# Patient Record
Sex: Male | Born: 1970 | Race: Black or African American | Hispanic: No | Marital: Single | Smoking: Never smoker
Health system: Southern US, Community
[De-identification: ages and names within clinical notes are randomized; demographics above are authoritative.]

## PROBLEM LIST (undated history)

## (undated) DIAGNOSIS — G937 Reye's syndrome: Secondary | ICD-10-CM

## (undated) DIAGNOSIS — G4730 Sleep apnea, unspecified: Secondary | ICD-10-CM

## (undated) DIAGNOSIS — F419 Anxiety disorder, unspecified: Secondary | ICD-10-CM

## (undated) DIAGNOSIS — I10 Essential (primary) hypertension: Secondary | ICD-10-CM

## (undated) DIAGNOSIS — E785 Hyperlipidemia, unspecified: Secondary | ICD-10-CM

## (undated) DIAGNOSIS — M069 Rheumatoid arthritis, unspecified: Secondary | ICD-10-CM

## (undated) DIAGNOSIS — R569 Unspecified convulsions: Secondary | ICD-10-CM

## (undated) HISTORY — DX: Hyperlipidemia, unspecified: E78.5

## (undated) HISTORY — DX: Sleep apnea, unspecified: G47.30

## (undated) HISTORY — DX: Rheumatoid arthritis, unspecified: M06.9

## (undated) HISTORY — PX: PLEURAL SCARIFICATION: SHX748

## (undated) HISTORY — DX: Reye's syndrome: G93.7

## (undated) HISTORY — PX: JOINT REPLACEMENT: SHX530

## (undated) HISTORY — DX: Unspecified convulsions: R56.9

## (undated) HISTORY — DX: Anxiety disorder, unspecified: F41.9

---

## 2019-07-17 ENCOUNTER — Ambulatory Visit (INDEPENDENT_AMBULATORY_CARE_PROVIDER_SITE_OTHER): Payer: BC Managed Care – PPO

## 2019-07-17 ENCOUNTER — Other Ambulatory Visit: Payer: Self-pay

## 2019-07-17 ENCOUNTER — Ambulatory Visit (HOSPITAL_COMMUNITY)
Admission: EM | Admit: 2019-07-17 | Discharge: 2019-07-17 | Disposition: A | Discharge status: Home / Self Care | Payer: BC Managed Care – PPO | Attending: Family Medicine | Admitting: Family Medicine

## 2019-07-17 ENCOUNTER — Encounter (HOSPITAL_COMMUNITY): Payer: Self-pay | Admitting: Emergency Medicine

## 2019-07-17 DIAGNOSIS — S9031XA Contusion of right foot, initial encounter: Secondary | ICD-10-CM | POA: Diagnosis not present

## 2019-07-17 DIAGNOSIS — M79671 Pain in right foot: Secondary | ICD-10-CM | POA: Diagnosis not present

## 2019-07-17 DIAGNOSIS — S99921A Unspecified injury of right foot, initial encounter: Secondary | ICD-10-CM | POA: Diagnosis not present

## 2019-07-17 HISTORY — DX: Essential (primary) hypertension: I10

## 2019-07-17 NOTE — ED Triage Notes (Signed)
Pt states he was in bed sleeping, had a dream and kicked his footboard during the dream.His right foot is having pain in and below the great toe.

## 2019-07-17 NOTE — Discharge Instructions (Signed)
Tylenol 5000-1000 mg every 4-6 hours, add in ibuprofen as needed Ice and elevate  Follow up if not improving or worsening

## 2019-07-17 NOTE — ED Provider Notes (Signed)
[facility]    CSN: [id number] Arrival date & time: 07/17/19  [id number]      History   Chief Complaint Chief Complaint  Patient presents with  . Foot Injury    right    HPI Scott Joseph is a 49 y.o. male history of arthritis, hypertension, presenting today for evaluation of right foot injury.  Patient states that approximately 2 nights ago he had a dream and accidentally kicked the footboard of his bed.  Since he has had pain at the base of his right great toe.  Initially had a limp, but this has been proved.  He has been taking ibuprofen as well as trying to ice and elevate.  Denies prior fracture to this foot.  Denies any ankle or lower leg pain.  HPI  Past Medical History:  Diagnosis Date  . Arthritis   . Hypertension     There are no problems to display for this patient.   Past Surgical History:  Procedure Laterality Date  . JOINT REPLACEMENT         Home Medications    Prior to Admission medications   Medication Sig Start Date End Date Taking? Authorizing Provider  amLODipine (NORVASC) 10 MG tablet Take 10 mg by mouth daily. 07/11/19  Yes [provider]  carvedilol (COREG) 3.125 MG tablet Take 3.125 mg by mouth 2 (two) times daily. 07/11/19  Yes [provider]  escitalopram (LEXAPRO) 10 MG tablet Take 10 mg by mouth daily. 07/11/19  Yes [provider]  lisinopril (ZESTRIL) 20 MG tablet Take 20 mg by mouth daily. 07/11/19  Yes [provider]    Family History Family History  Problem Relation Age of Onset  . Hypertension Mother   . CAD Mother   . Cancer Father   . Hypertension Father     Social History Social History   Tobacco Use  . Smoking status: Never Smoker  . Smokeless tobacco: Never Used  Substance Use Topics  . Alcohol use: Not Currently  . Drug use: Never     Allergies   Patient has no known allergies.   Review of Systems Review of Systems  Constitutional: Negative for fatigue and  fever.  Respiratory: Negative for shortness of breath.   Cardiovascular: Negative for chest pain and leg swelling.  Gastrointestinal: Negative for nausea and vomiting.  Musculoskeletal: Positive for arthralgias, gait problem and joint swelling. Negative for myalgias.  Skin: Negative for color change, rash and wound.  Neurological: Negative for dizziness, syncope, weakness, light-headedness and headaches.     Physical Exam Triage Vital Signs ED Triage Vitals  Enc Vitals Group     BP 07/17/19 1009 (!) 167/125     Pulse Rate 07/17/19 1009 72     Resp 07/17/19 1009 18     Temp 07/17/19 1009 98.1 F (36.7 C)     Temp Source 07/17/19 1009 Oral     SpO2 07/17/19 1009 97 %     Weight --      Height --      Head Circumference --      Peak Flow --      Pain Score 07/17/19 1003 2     Pain Loc --      Pain Edu? --      Excl. in [place]? --    No data found.  Updated Vital Signs BP (!) 167/125 (BP Location: Left Arm)   Pulse 72   Temp 98.1 F (36.7 C) (Oral)  Resp 18   SpO2 97%   Visual Acuity Right Eye Distance:   Left Eye Distance:   Bilateral Distance:    Right Eye Near:   Left Eye Near:    Bilateral Near:     Physical Exam Vitals and nursing note reviewed.  Constitutional:      Appearance: He is well-developed.     Comments: No acute distress  HENT:     Head: Normocephalic and atraumatic.     Nose: Nose normal.  Eyes:     Conjunctiva/sclera: Conjunctivae normal.  Cardiovascular:     Rate and Rhythm: Normal rate.  Pulmonary:     Effort: Pulmonary effort is normal. No respiratory distress.  Abdominal:     General: There is no distension.  Musculoskeletal:        General: Normal range of motion.     Cervical back: Neck supple.     Comments: Right foot: No obvious swelling or deformity, tenderness to palpation to distal aspect of first metatarsal, slight hyperpigmentation in this area Dorsalis pedis 2+; sensation intact distally  Skin:    General: Skin is warm  and dry.  Neurological:     Mental Status: He is alert and oriented to person, place, and time.      UC Treatments / Results  Labs (all labs ordered are listed, but only abnormal results are displayed) Labs Reviewed - No data to display  EKG   Radiology DG Foot Complete Right  Result Date: 07/17/2019 CLINICAL DATA:  Posttraumatic right foot pain EXAM: RIGHT FOOT COMPLETE - 3+ VIEW COMPARISON:  None. FINDINGS: There is no evidence of fracture or dislocation. 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 IMPRESSION: 1. No acute finding. 2. Notable first MTP osteoarthritis. Electronically Signed   By: Monte Fantasia M.D.   On: 07/17/2019 10:18    Procedures Procedures (including critical care time)  Medications Ordered in UC Medications - No data to display  Initial Impression / Assessment and Plan / UC Course  I have reviewed the triage vital signs and the nursing notes.  Pertinent labs & imaging results that were available during my care of the patient were reviewed by me and considered in my medical decision making (see chart for details).     X-ray negative for acute bony abnormality, suggestive of first metatarsal osteoarthritis.  Likely injury flaring this.  Will recommend to continue anti-inflammatories ice and elevate would expect gradual resolution.  Discussed elevated blood pressure with patient, he believes taking ibuprofen has contributed to this.  Discussed warning signs to monitor and to continue to monitor blood pressure at home.  Discussed strict return precautions. Patient verbalized understanding and is agreeable with plan.  Final Clinical Impressions(s) / UC Diagnoses   Final diagnoses:  Contusion of right foot, initial encounter     Discharge Instructions     Tylenol 5000-1000 mg every 4-6 hours, add in ibuprofen as needed Ice and elevate  Follow up if not improving or worsening   ED Prescriptions    None      PDMP not reviewed this encounter.   Janith Lima, PA-C 07/17/19 1032

## 2019-07-30 ENCOUNTER — Other Ambulatory Visit: Payer: Self-pay

## 2019-07-30 ENCOUNTER — Ambulatory Visit (INDEPENDENT_AMBULATORY_CARE_PROVIDER_SITE_OTHER): Payer: BC Managed Care – PPO

## 2019-07-30 ENCOUNTER — Encounter: Payer: Self-pay | Admitting: Medical-Surgical

## 2019-07-30 ENCOUNTER — Ambulatory Visit (INDEPENDENT_AMBULATORY_CARE_PROVIDER_SITE_OTHER): Payer: BC Managed Care – PPO | Admitting: Medical-Surgical

## 2019-07-30 VITALS — BP 145/90 | HR 112 | Temp 98.3°F | Ht 62.25 in | Wt 167.2 lb

## 2019-07-30 DIAGNOSIS — M069 Rheumatoid arthritis, unspecified: Secondary | ICD-10-CM

## 2019-07-30 DIAGNOSIS — I10 Essential (primary) hypertension: Secondary | ICD-10-CM

## 2019-07-30 DIAGNOSIS — F419 Anxiety disorder, unspecified: Secondary | ICD-10-CM

## 2019-07-30 DIAGNOSIS — M19072 Primary osteoarthritis, left ankle and foot: Secondary | ICD-10-CM | POA: Diagnosis not present

## 2019-07-30 DIAGNOSIS — M25475 Effusion, left foot: Secondary | ICD-10-CM | POA: Insufficient documentation

## 2019-07-30 MED ORDER — HYDROCODONE-ACETAMINOPHEN 5-325 MG PO TABS: 1.0000 | ORAL_TABLET | Freq: Three times a day (TID) | ORAL | 0 refills | Status: DC | PRN

## 2019-07-30 NOTE — Progress Notes (Signed)
New Patient Office Visit  Subjective:  Patient ID: Scott Joseph, male    DOB: Mar 18, 1971  Age: 49 y.o. MRN: [id number]  CC:  Chief Complaint  Patient presents with  . Establish Care  . Rheumatoid Arthritis    HPI Scott Joseph is a 49 year old male who presents today to establish care.  Moved from [place] in August 2020.  Works for a [employer], broadcasting/film/video.  Hypertension: Taking amlodipine, Coreg, and lisinopril.  Checks blood pressures at home intermittently.  Checked last night with reading of 131/86.  Eats a low-salt diet and tries to avoid processed foods.  Denies chest pain, shortness of breath, palpitations, lower extremity edema.  BP elevated in office but patient reports he was very anxious about the appointment today.   Rheumatoid arthritis: Reports having RA since the age of 60.  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  In his teenage years, was treated with Naprosyn.  Reports Naprosyn lost its efficacy after approximately 17 years.  No current treatment regimen but interested in starting medication to help manage symptoms.  Reports joints most often affected are right elbow, bilateral hands, and bilateral feet.   Anxiety: Taking Lexapro daily.  Reports no anxiety attacks since he moved back to [place].  PHQ-9 score of 3 with gad 7 score of 1.  Denies SI/HI.  Left great toe pain: Pain and tenderness with edema to the left great toe and MTP joints starting on Thursday.  Has made walking difficult and is a safety concern for his job.  Denies history of gout, fever, chills.  Past Medical History:  Diagnosis Date  . Anxiety   . Hypertension   . 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 Rheumatoid arthritis [facility])     Past Surgical History:  Procedure Laterality Date  . JOINT REPLACEMENT     Bilateral Hip Replacement  . PLEURAL SCARIFICATION      Family History  Problem Relation Age of Onset  .  Hypertension Mother   . CAD Mother   . Hypertension Father   . Lung cancer Father   . Diabetes Sister   . Diabetes Brother   . Hypertension Maternal Grandmother   . Hypertension Maternal Grandfather   . Hypertension Paternal Grandmother   . Hypertension Paternal Grandfather   . Diabetes Paternal Grandfather     Social History   Socioeconomic History  . Marital status: Single    Spouse name: Not on file  . Number of children: Not on file  . Years of education: Not on file  . Highest education level: Not on file  Occupational History  . Not on file  Tobacco Use  . Smoking status: Never Smoker  . Smokeless tobacco: Never Used  Substance and Sexual Activity  . Alcohol use: Yes    Comment: Rarely  . Drug use: Never  . Sexual activity: Yes    Partners: Female    Birth control/protection: None  Other Topics Concern  . Not on file  Social History Narrative  . Not on file   Social Determinants of Health   Financial Resource Strain:   . Difficulty of Paying Living Expenses: Not on file  Food Insecurity:   . Worried About Charity fundraiser in the Last Year: Not on file  . Ran Out of Food in the Last Year: Not on file  Transportation Needs:   . Lack of Transportation (Medical): Not on file  .  Lack of Transportation (Non-Medical): Not on file  Physical Activity:   . Days of Exercise per Week: Not on file  . Minutes of Exercise per Session: Not on file  Stress:   . Feeling of Stress : Not on file  Social Connections:   . Frequency of Communication with Friends and Family: Not on file  . Frequency of Social Gatherings with Friends and Family: Not on file  . Attends Religious Services: Not on file  . Active Member of Clubs or Organizations: Not on file  . Attends Archivist Meetings: Not on file  . Marital Status: Not on file  Intimate Partner Violence:   . Fear of Current or Ex-Partner: Not on file  . Emotionally Abused: Not on file  . Physically Abused:  Not on file  . Sexually Abused: Not on file    ROS Review of Systems  Constitutional: Negative for chills, fatigue and fever.  Respiratory: Negative for chest tightness and shortness of breath.   Cardiovascular: Negative for chest pain, palpitations and leg swelling.  Musculoskeletal: Positive for arthralgias (intermittent affecting hands, feet, and right elbow) and joint swelling (left great MTP joint).  Neurological: Negative for headaches.  Psychiatric/Behavioral: The patient is nervous/anxious (regarding appointment today).     Objective:   Today's Vitals: BP (!) 145/90   Pulse (!) 112   Temp 98.3 F (36.8 C) (Oral)   Ht 5' 2.25" (1.581 m)   Wt 167 lb 3.2 oz (75.8 kg)   SpO2 96%   BMI 30.34 kg/m   Physical Exam Vitals and nursing note reviewed.  Constitutional:      General: He is not in acute distress.    Appearance: Normal appearance.  HENT:     Head: Normocephalic and atraumatic.  Cardiovascular:     Rate and Rhythm: Normal rate and regular rhythm.     Pulses: Normal pulses.     Heart sounds: Normal heart sounds. No murmur. No friction rub. No gallop.   Pulmonary:     Effort: Pulmonary effort is normal. No respiratory distress.     Breath sounds: Normal breath sounds. No wheezing.  Musculoskeletal:        General: Swelling (left great toe MTP) and tenderness present.     Cervical back: Normal range of motion.     Left lower leg: Edema (left foot ) present.  Skin:    General: Skin is warm and dry.  Neurological:     Mental Status: He is alert and oriented to person, place, and time.  Psychiatric:        Mood and Affect: Mood normal.        Behavior: Behavior normal.        Thought Content: Thought content normal.        Judgment: Judgment normal.     Assessment & Plan:   Left great toe pain Consulted Dr. Dianah Field, Sports Medicine. See procedure note.  Essential hypertension Continue lisinopril 20 mg daily, amlodipine 10 mg daily, and Coreg 3.125  mg twice daily.  Continue checking blood pressures at home.  Low-sodium diet and exercise encouraged.  Checking CBC, CMP, and lipid panel today.  Rheumatoid arthritis (HCC) Checking rheumatoid panel.  Pending results, refer to rheumatology.  Anxiety Continue Lexapro 10 mg daily.   Outpatient Encounter Medications as of 07/30/2019  Medication Sig  . amLODipine (NORVASC) 10 MG tablet Take 10 mg by mouth daily.  . carvedilol (COREG) 3.125 MG tablet Take 3.125 mg by mouth 2 (two)  times daily.  Marland Kitchen escitalopram (LEXAPRO) 10 MG tablet Take 10 mg by mouth daily.  Marland Kitchen lisinopril (ZESTRIL) 20 MG tablet Take 20 mg by mouth daily.  Marland Kitchen HYDROcodone-acetaminophen (NORCO/VICODIN) 5-325 MG tablet Take 1 tablet by mouth every 8 (eight) hours as needed for moderate pain.   No facility-administered encounter medications on file as of 07/30/2019.    Follow-up: Return in about 4 weeks (around 08/27/2019) for annual physical exam and lab review.   Samuel Bouche, NP

## 2019-07-30 NOTE — Assessment & Plan Note (Signed)
Continue lisinopril 20 mg daily, amlodipine 10 mg daily, and Coreg 3.125 mg twice daily.  Continue checking blood pressures at home.  Low-sodium diet and exercise encouraged.  Checking CBC, CMP, and lipid panel today.

## 2019-07-30 NOTE — Assessment & Plan Note (Signed)
Checking rheumatoid panel.  Pending results, refer to rheumatology.

## 2019-07-30 NOTE — Progress Notes (Addendum)
    Procedures performed today:    Procedure: Real-time Ultrasound Guided injection of the left first MTP Device: Samsung HS60  Verbal informed consent obtained.  Time-out conducted.  Noted no overlying erythema, induration, or other signs of local infection.  Skin prepped in a sterile fashion.  Local anesthesia: Topical Ethyl chloride.  With sterile technique and under real time ultrasound guidance:  Noted significant osteoarthritis, synovitis, no drainable fluid collection, 1/2 cc Kenalog 40, 1/2 cc lidocaine injected slowly.   Completed without difficulty  Pain immediately resolved suggesting accurate placement of the medication.  Advised to call if fevers/chills, erythema, induration, drainage, or persistent bleeding.  Images permanently stored and available for review in the ultrasound unit.  Impression: Technically successful ultrasound guided injection.  Independent interpretation of tests performed by another provider:   None.  Impression and Recommendations:    Swelling of left first MTP:  Pleasant 49 year old male with 3 days of severe left first MTP swelling and pain. Severe pain and swelling of the left first MTP, question crystalline arthropathy flare, he does have a history of rheumatoid arthritis. We are going to confirm this with a full rheumatoid panel, uric acid levels. There was insufficient fluid to perform an arthrocentesis today however he did have significant synovitis and the joint was injected. Adding x-rays as well, hydrocodone for pain. Return to see me in a month.  Uric acid levels are high at 8.9, symptoms are likely due to a gout flare, adding allopurinol 300 mg daily, recheck in 1 month, goal is less than 5.  Still awaiting the rest of the rheumatoid work-up, but I think we have a diagnosis.   ___________________________________________ Gwen Her. Dianah Field, M.D., ABFM., CAQSM. Primary Care and [place] Instructor of [facility]

## 2019-07-30 NOTE — Assessment & Plan Note (Signed)
Continue Lexapro 10 mg daily

## 2019-07-30 NOTE — Assessment & Plan Note (Addendum)
Pleasant 49 year old male with 3 days of severe left first MTP swelling and pain. Severe pain and swelling of the left first MTP, question crystalline arthropathy flare, he does have a history of rheumatoid arthritis. We are going to confirm this with a full rheumatoid panel, uric acid levels. There was insufficient fluid to perform an arthrocentesis today however he did have significant synovitis and the joint was injected. Adding x-rays as well, hydrocodone for pain. Return to see me in a month.  Uric acid levels are high at 8.9, symptoms are likely due to a gout flare, adding allopurinol 300 mg daily, recheck in 1 month, goal is less than 5.  Still awaiting the rest of the rheumatoid work-up, but I think we have a diagnosis.

## 2019-07-31 MED ORDER — ALLOPURINOL 300 MG PO TABS: 300.0000 mg | ORAL_TABLET | Freq: Every day | ORAL | 6 refills | Status: DC

## 2019-07-31 NOTE — Addendum Note (Signed)
Addended by: Silverio Decamp on: 07/31/2019 08:48 AM   Modules accepted: Orders

## 2019-08-07 LAB — ANTI-NUCLEAR AB-TITER (ANA TITER): ANA Titer 1: 1:40 {titer} — ABNORMAL HIGH

## 2019-08-07 LAB — COMPLETE METABOLIC PANEL WITH GFR
AG Ratio: 1.4 (calc) (ref 1.0–2.5)
ALT: 17 U/L (ref 9–46)
AST: 13 U/L (ref 10–35)
Albumin: 4.4 g/dL (ref 3.6–5.1)
Alkaline phosphatase (APISO): 67 U/L (ref 35–144)
BUN/Creatinine Ratio: 17 (calc) (ref 6–22)
BUN: 26 mg/dL — ABNORMAL HIGH (ref 7–25)
CO2: 27 mmol/L (ref 20–32)
Calcium: 10 mg/dL (ref 8.6–10.3)
Chloride: 103 mmol/L (ref 98–110)
Creat: 1.51 mg/dL — ABNORMAL HIGH (ref 0.70–1.33)
GFR, Est African American: 62 mL/min/{1.73_m2} (ref >=60)
GFR, Est Non African American: 53 mL/min/{1.73_m2} — ABNORMAL LOW (ref >=60)
Globulin: 3.1 g/dL (calc) (ref 1.9–3.7)
Glucose, Bld: 111 mg/dL — ABNORMAL HIGH (ref 65–99)
Potassium: 4.4 mmol/L (ref 3.5–5.3)
Sodium: 139 mmol/L (ref 135–146)
Total Bilirubin: 0.4 mg/dL (ref 0.2–1.2)
Total Protein: 7.5 g/dL (ref 6.1–8.1)

## 2019-08-07 LAB — ANALYZER(R)ANA IFA WITH REFLEX TITER/PATTRN,SYS AUTOIMM PNL1
14-3-3 eta Protein: <0.2 ng/mL (ref <0.2)
Anti Nuclear Antibody (ANA): POSITIVE — AB
Anticardiolipin IgA: <11 [APL'U]
Anticardiolipin IgG: <14 [GPL'U]
Anticardiolipin IgM: <12 [MPL'U]
Beta-2 Glyco 1 IgA: <9 SAU (ref <=20)
Beta-2 Glyco 1 IgM: <9 SMU (ref <=20)
Beta-2 Glyco I IgG: <9 SGU (ref <=20)
C3 Complement: 175 mg/dL (ref 82–185)
C4 Complement: 45 mg/dL (ref 15–53)
Centromere Ab Screen: <1 AI
Chromatin (Nucleosomal) Antibody: <1 AI
Cyclic Citrullin Peptide Ab: 18 Units
DNA Ab (DS) Crithidia, IFA: NEGATIVE
ENA SM Ab Ser-aCnc: <1 AI
Jo-1 Autoabs: <1 AI
Rheumatoid Factor (IgA): <5 U
Rheumatoid Factor (IgG): <5 U
Rheumatoid Factor (IgM): <5 U
Ribonucleic Protein(ENA) Antibody, IgG: <1 AI
SM/RNP: <1 AI
SSA (Ro) (ENA) Antibody, IgG: <1 AI
SSB (La) (ENA) Antibody, IgG: <1 AI
Scleroderma (Scl-70) (ENA) Antibody, IgG: <1 AI
Thyroperoxidase Ab SerPl-aCnc: 2 IU/mL (ref <9)

## 2019-08-07 LAB — CBC
HCT: 41.7 % (ref 38.5–50.0)
Hemoglobin: 14 g/dL (ref 13.2–17.1)
MCH: 28.9 pg (ref 27.0–33.0)
MCHC: 33.6 g/dL (ref 32.0–36.0)
MCV: 86.2 fL (ref 80.0–100.0)
MPV: 11.6 fL (ref 7.5–12.5)
Platelets: 431 10*3/uL — ABNORMAL HIGH (ref 140–400)
RBC: 4.84 10*6/uL (ref 4.20–5.80)
RDW: 15.5 % — ABNORMAL HIGH (ref 11.0–15.0)
WBC: 12 10*3/uL — ABNORMAL HIGH (ref 3.8–10.8)

## 2019-08-07 LAB — LIPID PANEL
Cholesterol: 221 mg/dL — ABNORMAL HIGH (ref <200)
HDL: 43 mg/dL (ref >=40)
LDL Cholesterol (Calc): 146 mg/dL (calc) — ABNORMAL HIGH
Non-HDL Cholesterol (Calc): 178 mg/dL (calc) — ABNORMAL HIGH (ref <130)
Total CHOL/HDL Ratio: 5.1 (calc) — ABNORMAL HIGH (ref <5.0)
Triglycerides: 185 mg/dL — ABNORMAL HIGH (ref <150)

## 2019-08-07 LAB — URIC ACID: Uric Acid, Serum: 8.9 mg/dL — ABNORMAL HIGH (ref 4.0–8.0)

## 2019-09-03 ENCOUNTER — Ambulatory Visit (INDEPENDENT_AMBULATORY_CARE_PROVIDER_SITE_OTHER): Payer: BC Managed Care – PPO | Admitting: Medical-Surgical

## 2019-09-03 DIAGNOSIS — Z5329 Procedure and treatment not carried out because of patient's decision for other reasons: Secondary | ICD-10-CM

## 2019-09-03 NOTE — Progress Notes (Deleted)
HPI: Scott Joseph is a 49 y.o. 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  he presents to [facility] today, 09/03/19,  for chief complaint of:  Annual physical exam  Anxiety- Lexapro  HTN- amlodipine, coreg, lisinopril  Gout/RA- allopurinol  Past medical, surgical, social and family history reviewed:  Patient Active Problem List   Diagnosis Date Noted  . Swelling of first metatarsophalangeal (MTP) joint of left foot 07/30/2019  . Essential hypertension 07/30/2019  . Rheumatoid arthritis ([place]) 07/30/2019  . Anxiety 07/30/2019    Past Surgical History:  Procedure Laterality Date  . JOINT REPLACEMENT     Bilateral Hip Replacement  . PLEURAL SCARIFICATION      Social History   Tobacco Use  . Smoking status: Never Smoker  . Smokeless tobacco: Never Used  Substance Use Topics  . Alcohol use: Yes    Comment: Rarely    Family History  Problem Relation Age of Onset  . Hypertension Mother   . CAD Mother   . Hypertension Father   . Lung cancer Father   . Diabetes Sister   . Diabetes Brother   . Hypertension Maternal Grandmother   . Hypertension Maternal Grandfather   . Hypertension Paternal Grandmother   . Hypertension Paternal Grandfather   . Diabetes Paternal Grandfather      Current medication list and allergy/intolerance information reviewed:    Current Outpatient Medications  Medication Sig Dispense Refill  . allopurinol (ZYLOPRIM) 300 MG tablet Take 1 tablet (300 mg total) by mouth daily. 30 tablet 6  . amLODipine (NORVASC) 10 MG tablet Take 10 mg by mouth daily.    . carvedilol (COREG) 3.125 MG tablet Take 3.125 mg by mouth 2 (two) times daily.    Marland Kitchen escitalopram (LEXAPRO) 10 MG tablet Take 10 mg by mouth daily.    Marland Kitchen HYDROcodone-acetaminophen (NORCO/VICODIN) 5-325 MG tablet Take 1 tablet by mouth every 8 (eight) hours as needed for moderate pain.  15 tablet 0  . lisinopril (ZESTRIL) 20 MG tablet Take 20 mg by mouth daily.     No current facility-administered medications for this visit.    No Known Allergies    Review of Systems:  Constitutional:  No  fever, no chills, No recent illness, No unintentional weight changes. No significant fatigue.   HEENT: No  headache, no vision change, no hearing change, No sore throat, No  sinus pressure  Cardiac: No  chest pain, No  pressure, No palpitations, No  Orthopnea  Respiratory:  No  shortness of breath. No  Cough  Gastrointestinal: No  abdominal pain, No  nausea, No  vomiting,  No  blood in stool, No  diarrhea, No  constipation   Musculoskeletal: No new myalgia/arthralgia  Skin: No  Rash, No other wounds/concerning lesions  Genitourinary: No  incontinence, No  abnormal genital bleeding, No abnormal genital discharge  Hem/Onc: No  easy bruising/bleeding, No  abnormal lymph node  Endocrine: No cold intolerance,  No heat intolerance. No polyuria/polydipsia/polyphagia   Neurologic: No  weakness, No  dizziness, No  slurred speech/focal weakness/facial droop  Psychiatric: No  concerns with depression, No  concerns with anxiety, No sleep problems, No mood problems  Exam:  There were no vitals taken for this visit.  Constitutional: VS see above. General Appearance: alert, well-developed, well-nourished, NAD  Eyes: Normal lids and conjunctive, non-icteric sclera  Ears, Nose, Mouth, Throat: MMM, Normal external inspection ears/nares/mouth/lips/gums. TM normal bilaterally.  Pharynx/tonsils no erythema, no exudate. Nasal mucosa normal.   Neck: No masses, trachea midline. No thyroid enlargement. No tenderness/mass appreciated. No lymphadenopathy  Respiratory: Normal respiratory effort. no wheeze, no rhonchi, no rales  Cardiovascular: S1/S2 normal, no murmur, no rub/gallop auscultated. RRR. No lower extremity edema. Pedal pulse II/IV bilaterally DP and PT. No carotid bruit or JVD. No  abdominal aortic bruit.  Gastrointestinal: Nontender, no masses. No hepatomegaly, no splenomegaly. No hernia appreciated. Bowel sounds normal. Rectal exam deferred.   Musculoskeletal: Gait normal. No clubbing/cyanosis of digits.   Neurological: Normal balance/coordination. No tremor. No cranial nerve deficit on limited exam. Motor and sensation intact and symmetric. Cerebellar reflexes intact.   Skin: warm, dry, intact. No rash/ulcer. No concerning nevi or subq nodules on limited exam.    Psychiatric: Normal judgment/insight. Normal mood and affect. Oriented x3.    No results found for this or any previous visit (from the past 72 hour(s)).  No results found.   ASSESSMENT/PLAN:   No problem-specific Assessment & Plan notes found for this encounter.    No orders of the defined types were placed in this encounter.   No orders of the defined types were placed in this encounter.   Patient Instructions  Preventive Care 90-41 Years Old, Male Preventive care refers to lifestyle choices and visits with your health care provider that can promote health and wellness. This includes:  A yearly physical exam. This is also called an annual well check.  Regular dental and eye exams.  Immunizations.  Screening for certain conditions.  Healthy lifestyle choices, such as eating a healthy diet, getting regular exercise, not using drugs or products that contain nicotine and tobacco, and limiting alcohol use. What can I expect for my preventive care visit? Physical exam Your health care provider will check:  Height and weight. These may be used to calculate body mass index (BMI), which is a measurement that tells if you are at a healthy weight.  Heart rate and blood pressure.  Your skin for abnormal spots. Counseling Your health care provider may ask you questions about:  Alcohol, tobacco, and drug use.  Emotional well-being.  Home and relationship well-being.  Sexual  activity.  Eating habits.  Work and work Statistician. What immunizations do I need?  Influenza (flu) vaccine  This is recommended every year. Tetanus, diphtheria, and pertussis (Tdap) vaccine  You may need a Td booster every 10 years. Varicella (chickenpox) vaccine  You may need this vaccine if you have not already been vaccinated. Zoster (shingles) vaccine  You may need this after age 64. Measles, mumps, and rubella (MMR) vaccine  You may need at least one dose of MMR if you were born in 1957 or later. You may also need a second dose. Pneumococcal conjugate (PCV13) vaccine  You may need this if you have certain conditions and were not previously vaccinated. Pneumococcal polysaccharide (PPSV23) vaccine  You may need one or two doses if you smoke cigarettes or if you have certain conditions. Meningococcal conjugate (MenACWY) vaccine  You may need this if you have certain conditions. Hepatitis A vaccine  You may need this if you have certain conditions or if you travel or work in places where you may be exposed to hepatitis A. Hepatitis B vaccine  You may need this if you have certain conditions or if you travel or work in places where you may be exposed to hepatitis B. Haemophilus influenzae type b (Hib) vaccine  You may need this if you have  certain risk factors. Human papillomavirus (HPV) vaccine  If recommended by your health care provider, you may need three doses over 6 months. You may receive vaccines as individual doses or as more than one vaccine together in one shot (combination vaccines). Talk with your health care provider about the risks and benefits of combination vaccines. What tests do I need? Blood tests  Lipid and cholesterol levels. These may be checked every 5 years, or more frequently if you are over 4 years old.  Hepatitis C test.  Hepatitis B test. Screening  Lung cancer screening. You may have this screening every year starting at age 80 if  you have a 30-pack-year history of smoking and currently smoke or have quit within the past 15 years.  Prostate cancer screening. Recommendations will vary depending on your family history and other risks.  Colorectal cancer screening. All adults should have this screening starting at age 43 and continuing until age 65. Your health care provider may recommend screening at age 8 if you are at increased risk. You will have tests every 1-10 years, depending on your results and the type of screening test.  Diabetes screening. This is done by checking your blood sugar (glucose) after you have not eaten for a while (fasting). You may have this done every 1-3 years.  Sexually transmitted disease (STD) testing. Follow these instructions at home: Eating and drinking  Eat a diet that includes fresh fruits and vegetables, whole grains, lean protein, and low-fat dairy products.  Take vitamin and mineral supplements as recommended by your health care provider.  Do not drink alcohol if your health care provider tells you not to drink.  If you drink alcohol: ? Limit how much you have to 0-2 drinks a day. ? Be aware of how much alcohol is in your drink. In the [place] one drink equals one 12 oz bottle of beer (355 mL), one 5 oz glass of wine (148 mL), or one 1 oz glass of hard liquor (44 mL). Lifestyle  Take daily care of your teeth and gums.  Stay active. Exercise for at least 30 minutes on 5 or more days each week.  Do not use any products that contain nicotine or tobacco, such as cigarettes, e-cigarettes, and chewing tobacco. If you need help quitting, ask your health care provider.  If you are sexually active, practice safe sex. Use a condom or other form of protection to prevent STIs (sexually transmitted infections).  Talk with your health care provider about taking a low-dose aspirin every day starting at age 84. What's next?  Go to your health care provider once a year for a well check  visit.  Ask your health care provider how often you should have your eyes and teeth checked.  Stay up to date on all vaccines. This information is not intended to replace advice given to you by your health care provider. Make sure you discuss any questions you have with your health care provider. Document Revised: 06/22/2018 Document Reviewed: 06/22/2018 Elsevier Patient Education  [place].   Follow-up plan: No follow-ups on file.

## 2019-11-12 NOTE — Progress Notes (Signed)
  Subjective:    CC:   HPI:   I reviewed the past medical history, family history, social history, surgical history, and allergies today and no changes were needed.  Please see the problem list section below in epic for further details.  Past Medical History: Past Medical History:  Diagnosis Date  . Anxiety   . Hypertension   . 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 Rheumatoid arthritis [facility])    Past Surgical History: Past Surgical History:  Procedure Laterality Date  . JOINT REPLACEMENT     Bilateral Hip Replacement  . PLEURAL SCARIFICATION     Social History: Social History   Socioeconomic History  . Marital status: Single    Spouse name: Not on file  . Number of children: Not on file  . Years of education: Not on file  . Highest education level: Not on file  Occupational History  . Not on file  Tobacco Use  . Smoking status: Never Smoker  . Smokeless tobacco: Never Used  Substance and Sexual Activity  . Alcohol use: Yes    Comment: Rarely  . Drug use: Never  . Sexual activity: Yes    Partners: Female    Birth control/protection: None  Other Topics Concern  . Not on file  Social History Narrative  . Not on file   Social Determinants of Health   Financial Resource Strain:   . Difficulty of Paying Living Expenses:   Food Insecurity:   . Worried About Charity fundraiser in the Last Year:   . Arboriculturist in the Last Year:   Transportation Needs:   . Film/video editor (Medical):   Marland Kitchen Lack of Transportation (Non-Medical):   Physical Activity:   . Days of Exercise per Week:   . Minutes of Exercise per Session:   Stress:   . Feeling of Stress :   Social Connections:   . Frequency of Communication with Friends and Family:   . Frequency of Social Gatherings with Friends and Family:   . Attends Religious Services:   . Active Member of Clubs or Organizations:   . Attends Archivist Meetings:   Marland Kitchen Marital Status:    Family History: Family History   Problem Relation Age of Onset  . Hypertension Mother   . CAD Mother   . Hypertension Father   . Lung cancer Father   . Diabetes Sister   . Diabetes Brother   . Hypertension Maternal Grandmother   . Hypertension Maternal Grandfather   . Hypertension Paternal Grandmother   . Hypertension Paternal Grandfather   . Diabetes Paternal Grandfather    Allergies: No Known Allergies Medications: See med rec.  Review of Systems: No fevers, chills, night sweats, weight loss, chest pain, or shortness of breath.   Objective:    General: Well Developed, well nourished, and in no acute distress.  Neuro: Alert and oriented x3, extra-ocular muscles intact, sensation grossly intact.  HEENT: Normocephalic, atraumatic, pupils equal round reactive to light, neck supple, no masses, no lymphadenopathy, thyroid nonpalpable.  Skin: Warm and dry, no rashes. Cardiac: Regular rate and rhythm, no murmurs rubs or gallops, no lower extremity edema.  Respiratory: Clear to auscultation bilaterally. Not using accessory muscles, speaking in full sentences.   Impression and Recommendations:    No problem-specific Assessment & Plan notes found for this encounter.   ___________________________________________ Clearnce Sorrel, DNP, APRN, FNP-BC Primary Care and [place]

## 2019-11-13 ENCOUNTER — Ambulatory Visit (INDEPENDENT_AMBULATORY_CARE_PROVIDER_SITE_OTHER): Payer: BC Managed Care – PPO | Admitting: Medical-Surgical

## 2019-11-13 DIAGNOSIS — M25475 Effusion, left foot: Secondary | ICD-10-CM

## 2019-11-13 DIAGNOSIS — Z114 Encounter for screening for human immunodeficiency virus [HIV]: Secondary | ICD-10-CM

## 2019-11-13 DIAGNOSIS — Z5329 Procedure and treatment not carried out because of patient's decision for other reasons: Secondary | ICD-10-CM

## 2019-11-14 NOTE — Progress Notes (Signed)
Subjective:    CC: post covid follow up, gout follow up  HPI: Pleasant 49 year old male presenting today for 21-day post Covid follow-up.  Also due for follow-up on gout.  Covid- 21 days out, got it from his boss. Still having a bit of diarrhea, 3 BMs a day on average described as soft with some liquid. Poor appetite during covid that has somewhat improved but not back to normal. All other s/s resolved.  Gout- taking Allopurinol as ordered. No further gout flares. Reports much improved discomfort to his left foot. No longer painful or difficult to put shoes on.  Anxiety- taking Lexapro 10mg  daily. Reports this dose works well and he feels his symptoms are well controlled. No SI/HI.  HTN- taking Amlodipine 10mg  daily, Lisinopril 20mg  daily, and carvedilol 3.125mg  BID. Took medications just prior to appointment this morning. BP very elevated in office. Some concern that he may not have taken the Lisinopril as he reported that he is out of it but then reported he was almost out. Checks BP at home intermittently, reports readings are much better than today but unable to provide numbers.   I reviewed the past medical history, family history, social history, surgical history, and allergies today and no changes were needed.  Please see the problem list section below in epic for further details.  Past Medical History: Past Medical History:  Diagnosis Date  . Anxiety   . Hypertension   . 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 Rheumatoid arthritis [facility])    Past Surgical History: Past Surgical History:  Procedure Laterality Date  . JOINT REPLACEMENT     Bilateral Hip Replacement  . PLEURAL SCARIFICATION     Social History: Social History   Socioeconomic History  . Marital status: Single    Spouse name: Not on file  . Number of children: Not on file  . Years of education: Not on file  . Highest education level: Not on file  Occupational History  . Not on file  Tobacco Use  . Smoking status:  Never Smoker  . Smokeless tobacco: Never Used  Substance and Sexual Activity  . Alcohol use: Yes    Comment: Rarely  . Drug use: Never  . Sexual activity: Yes    Partners: Female    Birth control/protection: None  Other Topics Concern  . Not on file  Social History Narrative  . Not on file   Social Determinants of Health   Financial Resource Strain:   . Difficulty of Paying Living Expenses:   Food Insecurity:   . Worried About Charity fundraiser in the Last Year:   . Arboriculturist in the Last Year:   Transportation Needs:   . Film/video editor (Medical):   Marland Kitchen Lack of Transportation (Non-Medical):   Physical Activity:   . Days of Exercise per Week:   . Minutes of Exercise per Session:   Stress:   . Feeling of Stress :   Social Connections:   . Frequency of Communication with Friends and Family:   . Frequency of Social Gatherings with Friends and Family:   . Attends Religious Services:   . Active Member of Clubs or Organizations:   . Attends Archivist Meetings:   Marland Kitchen Marital Status:    Family History: Family History  Problem Relation Age of Onset  . Hypertension Mother   . CAD Mother   . Hypertension Father   . Lung cancer Father   . Diabetes Sister   .  Diabetes Brother   . Hypertension Maternal Grandmother   . Hypertension Maternal Grandfather   . Hypertension Paternal Grandmother   . Hypertension Paternal Grandfather   . Diabetes Paternal Grandfather    Allergies: No Known Allergies Medications: See med rec.  Review of Systems: No fevers, chills, night sweats, weight loss, chest pain, or shortness of breath.   Objective:    General: Well Developed, well nourished, and in no acute distress.  Neuro: Alert and oriented x3.  HEENT: Normocephalic, atraumatic.  Skin: Warm and dry. Cardiac: Regular rate and rhythm, no murmurs rubs or gallops, no lower extremity edema.  Respiratory: Clear to auscultation bilaterally. Not using accessory  muscles, speaking in full sentences.   Impression and Recommendations:    1. History of COVID-19 Symptoms resolved except loose stools. Recommend adding fiber supplement to bulk stools. Eat several small meals each day to increase caloric intake. If diarrhea increases, consider using as needed Imodium.  2. Gout of foot, unspecified cause, unspecified chronicity, unspecified laterality Continue Allopurinol. Rechecking uric acid. - Uric acid  3. Screening for HIV (human immunodeficiency virus) Discussed screening recommendations for HIV. Patient amenable to completing screening. Added to lab work today. - HIV Antibody (routine testing w rflx)  4. Screening for colon cancer Due for colonoscopy. Discussed with patient and he is okay with going forward. Referral placed to GI. - Ambulatory referral to Gastroenterology  5. Anxiety Continue Lexapro 10mg  daily, refills provided.  6. Essential HTN Continue current regimen of Amlodipine, Lisinopril, and Carvedilol. Strongly encouraged to check BP at home daily and record readings. Return for nurse visit and bring log with him. Do not miss doses of medications. Refills provided.   Return in about 1 week (around 11/22/2019) for nurse visit for BP check. ___________________________________________ Clearnce Sorrel, DNP, APRN, FNP-BC Primary Care and [place]

## 2019-11-15 ENCOUNTER — Ambulatory Visit (INDEPENDENT_AMBULATORY_CARE_PROVIDER_SITE_OTHER): Payer: BC Managed Care – PPO | Admitting: Medical-Surgical

## 2019-11-15 ENCOUNTER — Encounter: Payer: Self-pay | Admitting: Medical-Surgical

## 2019-11-15 ENCOUNTER — Encounter: Payer: Self-pay | Admitting: Gastroenterology

## 2019-11-15 ENCOUNTER — Other Ambulatory Visit: Payer: Self-pay

## 2019-11-15 VITALS — BP 187/141 | HR 66 | Temp 98.1°F | Ht 62.25 in | Wt 160.3 lb

## 2019-11-15 DIAGNOSIS — Z8616 Personal history of COVID-19: Secondary | ICD-10-CM

## 2019-11-15 DIAGNOSIS — M109 Gout, unspecified: Secondary | ICD-10-CM

## 2019-11-15 DIAGNOSIS — Z1211 Encounter for screening for malignant neoplasm of colon: Secondary | ICD-10-CM

## 2019-11-15 DIAGNOSIS — I10 Essential (primary) hypertension: Secondary | ICD-10-CM

## 2019-11-15 DIAGNOSIS — F419 Anxiety disorder, unspecified: Secondary | ICD-10-CM

## 2019-11-15 DIAGNOSIS — Z114 Encounter for screening for human immunodeficiency virus [HIV]: Secondary | ICD-10-CM | POA: Diagnosis not present

## 2019-11-15 MED ORDER — LISINOPRIL 20 MG PO TABS: 20.0000 mg | ORAL_TABLET | Freq: Every day | ORAL | 1 refills | Status: DC

## 2019-11-15 MED ORDER — AMLODIPINE BESYLATE 10 MG PO TABS: 10.0000 mg | ORAL_TABLET | Freq: Every day | ORAL | 1 refills | Status: DC

## 2019-11-15 MED ORDER — ESCITALOPRAM OXALATE 10 MG PO TABS: 10.0000 mg | ORAL_TABLET | Freq: Every day | ORAL | 1 refills | Status: DC

## 2019-11-15 MED ORDER — CARVEDILOL 3.125 MG PO TABS: 3.1250 mg | ORAL_TABLET | Freq: Two times a day (BID) | ORAL | 1 refills | Status: DC

## 2019-11-16 LAB — HIV ANTIBODY (ROUTINE TESTING W REFLEX): HIV 1&2 Ab, 4th Generation: NONREACTIVE

## 2019-11-16 LAB — URIC ACID: Uric Acid, Serum: 8.8 mg/dL — ABNORMAL HIGH (ref 4.0–8.0)

## 2019-11-23 ENCOUNTER — Ambulatory Visit: Payer: BC Managed Care – PPO

## 2019-12-13 ENCOUNTER — Other Ambulatory Visit: Payer: Self-pay

## 2019-12-13 ENCOUNTER — Ambulatory Visit (AMBULATORY_SURGERY_CENTER): Payer: Self-pay | Admitting: *Deleted

## 2019-12-13 VITALS — Ht 62.25 in | Wt 164.0 lb

## 2019-12-13 DIAGNOSIS — Z1211 Encounter for screening for malignant neoplasm of colon: Secondary | ICD-10-CM

## 2019-12-13 MED ORDER — SUTAB 1479-225-188 MG PO TABS: 1.0000 | ORAL_TABLET | Freq: Once | ORAL | 0 refills | Status: AC

## 2019-12-13 NOTE — Progress Notes (Signed)
Pt had covid virus beginning of April- no need for covid test  Pt is aware that care partner will wait in the car during procedure; if they feel like they will be too hot or cold to wait in the car; they may wait in the 4 th floor lobby. Patient is aware to bring only one care partner. We want them to wear a mask (we do not have any that we can provide them), practice social distancing, and we will check their temperatures when they get here.  I did remind the patient that their care partner needs to stay in the parking lot the entire time and have a cell phone available, we will call them when the pt is ready for discharge. Patient will wear mask into building.   No trouble with anesthesia, difficulty with moving neck or hx/fam hx of malignant hyperthermia per pt   No egg or soy allergy  No home oxygen use   No medications for weight loss taken  emmi information given  Pt denies constipation issues   Sutab code put into RX and paper copy given to pt to show pharmacy

## 2019-12-17 ENCOUNTER — Encounter: Payer: Self-pay | Admitting: Gastroenterology

## 2019-12-21 ENCOUNTER — Telehealth: Payer: Self-pay | Admitting: Gastroenterology

## 2019-12-21 DIAGNOSIS — Z1211 Encounter for screening for malignant neoplasm of colon: Secondary | ICD-10-CM

## 2019-12-21 MED ORDER — SUTAB 1479-225-188 MG PO TABS: 24.0000 | ORAL_TABLET | ORAL | 0 refills | Status: DC

## 2019-12-21 NOTE — Telephone Encounter (Signed)
Pt stated that pharmacy has not received prescription for Sutab.  Please send rx to [facility] on [address].

## 2019-12-21 NOTE — Telephone Encounter (Signed)
Sent in script with coupon code for sutab to [facility] [address]

## 2019-12-25 ENCOUNTER — Encounter: Payer: Self-pay | Admitting: Gastroenterology

## 2019-12-25 ENCOUNTER — Ambulatory Visit (AMBULATORY_SURGERY_CENTER): Payer: BC Managed Care – PPO | Admitting: Gastroenterology

## 2019-12-25 ENCOUNTER — Other Ambulatory Visit: Payer: Self-pay

## 2019-12-25 VITALS — BP 121/82 | HR 78 | Temp 96.6°F | Resp 12 | Ht 62.25 in | Wt 164.0 lb

## 2019-12-25 DIAGNOSIS — D123 Benign neoplasm of transverse colon: Secondary | ICD-10-CM

## 2019-12-25 DIAGNOSIS — Z1211 Encounter for screening for malignant neoplasm of colon: Secondary | ICD-10-CM | POA: Diagnosis not present

## 2019-12-25 DIAGNOSIS — D120 Benign neoplasm of cecum: Secondary | ICD-10-CM

## 2019-12-25 MED ORDER — SODIUM CHLORIDE 0.9 % IV SOLN: 500.0000 mL | Freq: Once | INTRAVENOUS | Status: DC

## 2019-12-25 NOTE — Progress Notes (Signed)
robinol antisialogogue 

## 2019-12-25 NOTE — Progress Notes (Signed)
A and O x3. Report to RN. Tolerated MAC anesthesia well.

## 2019-12-25 NOTE — Progress Notes (Signed)
Called to room to assist during endoscopic procedure.  Patient ID and intended procedure confirmed with present staff. Received instructions for my participation in the procedure from the performing physician.  

## 2019-12-25 NOTE — Op Note (Signed)
[place] Patient Name: Scott Joseph Procedure Date: 12/25/2019 10:21 AM MRN: [id number] Endoscopist: Remo Lipps P. Havery Moros , MD Age: 49 Referring MD:  Date of Birth: 10/04/1970 Gender: Male Account #: [id number] Procedure:                Colonoscopy Indications:              Screening for colorectal malignant neoplasm, This                            is the patient's first colonoscopy Medicines:                Monitored Anesthesia Care Procedure:                Pre-Anesthesia Assessment:                           - Prior to the procedure, a History and Physical                            was performed, and patient medications and                            allergies were reviewed. The patient's tolerance of                            previous anesthesia was also reviewed. The risks                            and benefits of the procedure and the sedation                            options and risks were discussed with the patient.                            All questions were answered, and informed consent                            was obtained. Prior Anticoagulants: The patient has                            taken no previous anticoagulant or antiplatelet                            agents. ASA Grade Assessment: II - A patient with                            mild systemic disease. After reviewing the risks                            and benefits, the patient was deemed in                            satisfactory condition to undergo the procedure.  After obtaining informed consent, the colonoscope                            was passed under direct vision. Throughout the                            procedure, the patient's blood pressure, pulse, and                            oxygen saturations were monitored continuously. The                            Colonoscope was introduced through the anus and                            advanced to the the  cecum, identified by                            appendiceal orifice and ileocecal valve. The                            colonoscopy was performed without difficulty. The                            patient tolerated the procedure well. The quality                            of the bowel preparation was good. The ileocecal                            valve, appendiceal orifice, and rectum were                            photographed. Scope In: 10:27:27 AM Scope Out: 10:46:22 AM Scope Withdrawal Time: 0 hours 14 minutes 57 seconds  Total Procedure Duration: 0 hours 18 minutes 55 seconds  Findings:                 The perianal and digital rectal examinations were                            normal.                           A 3 mm polyp was found in the cecum. The polyp was                            sessile. The polyp was removed with a cold snare.                            Resection and retrieval were complete.                           A 3 mm polyp was found in the transverse colon. The  polyp was sessile. The polyp was removed with a                            cold snare. Resection and retrieval were complete.                           Internal hemorrhoids were found during retroflexion.                           The exam was otherwise without abnormality. Complications:            No immediate complications. Estimated blood loss:                            Minimal. Estimated Blood Loss:     Estimated blood loss was minimal. Impression:               - One 3 mm polyp in the cecum, removed with a cold                            snare. Resected and retrieved.                           - One 3 mm polyp in the transverse colon, removed                            with a cold snare. Resected and retrieved.                           - Internal hemorrhoids.                           - The examination was otherwise normal. Recommendation:           - Patient has a  contact number available for                            emergencies. The signs and symptoms of potential                            delayed complications were discussed with the                            patient. Return to normal activities tomorrow.                            Written discharge instructions were provided to the                            patient.                           - Resume previous diet.                           - Continue present medications.                           -  Await pathology results. Remo Lipps P. Artelia Game, MD 12/25/2019 10:50:27 AM This report has been signed electronically.

## 2019-12-25 NOTE — Patient Instructions (Signed)
Handouts given for polyps and hemorrhoids.  Await pathology results.  YOU HAD AN ENDOSCOPIC PROCEDURE TODAY AT [facility]:   Refer to the procedure report that was given to you for any specific questions about what was found during the examination.  If the procedure report does not answer your questions, please call your gastroenterologist to clarify.  If you requested that your care partner not be given the details of your procedure findings, then the procedure report has been included in a sealed envelope for you to review at your convenience later.  YOU SHOULD EXPECT: Some feelings of bloating in the abdomen. Passage of more gas than usual.  Walking can help get rid of the air that was put into your GI tract during the procedure and reduce the bloating. If you had a lower endoscopy (such as a colonoscopy or flexible sigmoidoscopy) you may notice spotting of blood in your stool or on the toilet paper. If you underwent a bowel prep for your procedure, you may not have a normal bowel movement for a few days.  Please Note:  You might notice some irritation and congestion in your nose or some drainage.  This is from the oxygen used during your procedure.  There is no need for concern and it should clear up in a day or so.  SYMPTOMS TO REPORT IMMEDIATELY:   Following lower endoscopy (colonoscopy or flexible sigmoidoscopy):  Excessive amounts of blood in the stool  Significant tenderness or worsening of abdominal pains  Swelling of the abdomen that is new, acute  Fever of 100F or higher  For urgent or emergent issues, a gastroenterologist can be reached at any hour by calling [phone]. Do not use MyChart messaging for urgent concerns.    DIET:  We do recommend a small meal at first, but then you may proceed to your regular diet.  Drink plenty of fluids but you should avoid alcoholic beverages for 24 hours.  ACTIVITY:  You should plan to take it easy for the rest of today  and you should NOT DRIVE or use heavy machinery until tomorrow (because of the sedation medicines used during the test).    FOLLOW UP: Our staff will call the number listed on your records 48-72 hours following your procedure to check on you and address any questions or concerns that you may have regarding the information given to you following your procedure. If we do not reach you, we will leave a message.  We will attempt to reach you two times.  During this call, we will ask if you have developed any symptoms of COVID 19. If you develop any symptoms (ie: fever, flu-like symptoms, shortness of breath, cough etc.) before then, please call [phone].  If you test positive for Covid 19 in the 2 weeks post procedure, please call and report this information to [place].    If any biopsies were taken you will be contacted by phone or by letter within the next 1-3 weeks.  Please call us at [phone] if you have not heard about the biopsies in 3 weeks.    SIGNATURES/CONFIDENTIALITY: You and/or your care partner have signed paperwork which will be entered into your electronic medical record.  These signatures attest to the fact that that the information above on your After Visit Summary has been reviewed and is understood.  Full responsibility of the confidentiality of this discharge information lies with you and/or your care-partner.

## 2019-12-25 NOTE — Progress Notes (Signed)
Vitals-CW  Pt's states no medical or surgical changes since previsit or office visit. 

## 2019-12-27 ENCOUNTER — Telehealth: Payer: Self-pay | Admitting: *Deleted

## 2019-12-27 ENCOUNTER — Telehealth: Payer: Self-pay

## 2019-12-27 NOTE — Telephone Encounter (Signed)
  Follow up Call-  Call back number 12/25/2019  Post procedure Call Back phone  # [phone]  Permission to leave phone message Yes     Patient questions:  Do you have a fever, pain , or abdominal swelling? No. Pain Score  0 *  Have you tolerated food without any problems? Yes.    Have you been able to return to your normal activities? Yes.    Do you have any questions about your discharge instructions: Diet   No. Medications  No. Follow up visit  No.  Do you have questions or concerns about your Care? No.  Actions: * If pain score is 4 or above: No action needed, pain <4.  Have you developed a fever since your procedure? No 2.   Have you had an respiratory symptoms (SOB or cough) since your procedure? No  3.   Have you tested positive for COVID 19 since your procedure No  4.   Have you had any family members/close contacts diagnosed with the COVID 19 since your procedure?  No   If yes to any of these questions please route to Joylene John, RN and Erenest Rasher, RN

## 2019-12-27 NOTE — Telephone Encounter (Signed)
  Follow up Call-  Call back number 12/25/2019  Post procedure Call Back phone  # [phone]  Permission to leave phone message Yes     Patient questions:  Message left to call us if necessary.

## 2020-02-21 ENCOUNTER — Other Ambulatory Visit: Payer: Self-pay

## 2020-02-21 DIAGNOSIS — I10 Essential (primary) hypertension: Secondary | ICD-10-CM

## 2020-02-21 DIAGNOSIS — F419 Anxiety disorder, unspecified: Secondary | ICD-10-CM

## 2020-02-21 DIAGNOSIS — M25475 Effusion, left foot: Secondary | ICD-10-CM

## 2020-02-21 MED ORDER — ESCITALOPRAM OXALATE 10 MG PO TABS: 10.0000 mg | ORAL_TABLET | Freq: Every day | ORAL | 0 refills | Status: DC

## 2020-02-21 MED ORDER — CARVEDILOL 3.125 MG PO TABS: 3.1250 mg | ORAL_TABLET | Freq: Two times a day (BID) | ORAL | 0 refills | Status: DC

## 2020-02-21 MED ORDER — AMLODIPINE BESYLATE 10 MG PO TABS: 10.0000 mg | ORAL_TABLET | Freq: Every day | ORAL | 0 refills | Status: DC

## 2020-02-21 MED ORDER — ALLOPURINOL 300 MG PO TABS: 300.0000 mg | ORAL_TABLET | Freq: Every day | ORAL | 0 refills | Status: DC

## 2020-02-21 MED ORDER — LISINOPRIL 20 MG PO TABS: 20.0000 mg | ORAL_TABLET | Freq: Every day | ORAL | 0 refills | Status: DC

## 2020-02-26 ENCOUNTER — Telehealth: Payer: Self-pay | Admitting: Gastroenterology

## 2020-02-26 NOTE — Telephone Encounter (Signed)
Spoke with patient, he states that he saw a small amount of bright red blood when he wiped yesterday after his BM. Advised patient that internal hemorrhoids were noted on colon from 12/2019. Advised that some bleeding with hemorrhoids is normal if they become inflamed or irritated. Advised patient to increase fiber, fruits and vegetables so that stools are not too hard to pass. Advised patient that if bleeding increased then he would need to give [place] a call and let us know or if he noticed that his stools were becoming dark.

## 2020-07-06 ENCOUNTER — Other Ambulatory Visit: Payer: Self-pay | Admitting: Medical-Surgical

## 2020-07-06 DIAGNOSIS — I10 Essential (primary) hypertension: Secondary | ICD-10-CM

## 2020-08-02 ENCOUNTER — Other Ambulatory Visit: Payer: Self-pay | Admitting: Medical-Surgical

## 2020-08-02 DIAGNOSIS — F419 Anxiety disorder, unspecified: Secondary | ICD-10-CM

## 2020-08-02 DIAGNOSIS — M25475 Effusion, left foot: Secondary | ICD-10-CM

## 2020-08-02 DIAGNOSIS — I10 Essential (primary) hypertension: Secondary | ICD-10-CM

## 2020-08-05 ENCOUNTER — Encounter: Payer: BC Managed Care – PPO | Admitting: Medical-Surgical

## 2020-08-05 ENCOUNTER — Encounter: Payer: BC Managed Care – PPO | Admitting: Sports Medicine

## 2020-08-07 ENCOUNTER — Other Ambulatory Visit: Payer: Self-pay | Admitting: Medical-Surgical

## 2020-08-07 DIAGNOSIS — I10 Essential (primary) hypertension: Secondary | ICD-10-CM

## 2020-08-07 DIAGNOSIS — F419 Anxiety disorder, unspecified: Secondary | ICD-10-CM

## 2020-08-07 DIAGNOSIS — M25475 Effusion, left foot: Secondary | ICD-10-CM

## 2020-08-07 MED ORDER — ESCITALOPRAM OXALATE 10 MG PO TABS: 10.0000 mg | ORAL_TABLET | Freq: Every day | ORAL | 0 refills | Status: DC

## 2020-08-07 MED ORDER — CARVEDILOL 3.125 MG PO TABS: 3.1250 mg | ORAL_TABLET | Freq: Two times a day (BID) | ORAL | 0 refills | Status: DC

## 2020-08-07 MED ORDER — LISINOPRIL 20 MG PO TABS: 20.0000 mg | ORAL_TABLET | Freq: Every day | ORAL | 0 refills | Status: DC

## 2020-08-07 MED ORDER — ALLOPURINOL 300 MG PO TABS: 300.0000 mg | ORAL_TABLET | Freq: Every day | ORAL | 0 refills | Status: DC

## 2020-08-07 MED ORDER — AMLODIPINE BESYLATE 10 MG PO TABS: 10.0000 mg | ORAL_TABLET | Freq: Every day | ORAL | 0 refills | Status: DC

## 2020-08-26 ENCOUNTER — Ambulatory Visit (INDEPENDENT_AMBULATORY_CARE_PROVIDER_SITE_OTHER): Payer: Managed Care, Other (non HMO) | Admitting: Medical-Surgical

## 2020-08-26 ENCOUNTER — Other Ambulatory Visit: Payer: Self-pay

## 2020-08-26 ENCOUNTER — Ambulatory Visit (INDEPENDENT_AMBULATORY_CARE_PROVIDER_SITE_OTHER): Payer: Managed Care, Other (non HMO) | Admitting: Sports Medicine

## 2020-08-26 ENCOUNTER — Ambulatory Visit (INDEPENDENT_AMBULATORY_CARE_PROVIDER_SITE_OTHER): Payer: Managed Care, Other (non HMO)

## 2020-08-26 ENCOUNTER — Encounter: Payer: Self-pay | Admitting: Medical-Surgical

## 2020-08-26 VITALS — BP 147/89 | HR 78 | Temp 98.4°F | Ht 62.25 in | Wt 156.3 lb

## 2020-08-26 DIAGNOSIS — M25475 Effusion, left foot: Secondary | ICD-10-CM | POA: Diagnosis not present

## 2020-08-26 DIAGNOSIS — Z125 Encounter for screening for malignant neoplasm of prostate: Secondary | ICD-10-CM

## 2020-08-26 DIAGNOSIS — Z23 Encounter for immunization: Secondary | ICD-10-CM | POA: Diagnosis not present

## 2020-08-26 DIAGNOSIS — F419 Anxiety disorder, unspecified: Secondary | ICD-10-CM

## 2020-08-26 DIAGNOSIS — I10 Essential (primary) hypertension: Secondary | ICD-10-CM

## 2020-08-26 DIAGNOSIS — Z1159 Encounter for screening for other viral diseases: Secondary | ICD-10-CM

## 2020-08-26 DIAGNOSIS — Z0000 Encounter for general adult medical examination without abnormal findings: Secondary | ICD-10-CM

## 2020-08-26 DIAGNOSIS — Z1329 Encounter for screening for other suspected endocrine disorder: Secondary | ICD-10-CM

## 2020-08-26 DIAGNOSIS — M170 Bilateral primary osteoarthritis of knee: Secondary | ICD-10-CM | POA: Diagnosis not present

## 2020-08-26 DIAGNOSIS — E782 Mixed hyperlipidemia: Secondary | ICD-10-CM

## 2020-08-26 DIAGNOSIS — M25561 Pain in right knee: Secondary | ICD-10-CM | POA: Insufficient documentation

## 2020-08-26 MED ORDER — ESCITALOPRAM OXALATE 10 MG PO TABS: 10.0000 mg | ORAL_TABLET | Freq: Every day | ORAL | 1 refills | Status: DC

## 2020-08-26 MED ORDER — AMLODIPINE BESYLATE 10 MG PO TABS
10.0000 mg | ORAL_TABLET | Freq: Every day | ORAL | 0 refills | Status: DC
Start: 2020-08-26 — End: 2020-08-26

## 2020-08-26 MED ORDER — LISINOPRIL 20 MG PO TABS: 20.0000 mg | ORAL_TABLET | Freq: Every day | ORAL | 1 refills | Status: DC

## 2020-08-26 MED ORDER — AMLODIPINE BESYLATE 10 MG PO TABS: 10.0000 mg | ORAL_TABLET | Freq: Every day | ORAL | 1 refills | Status: DC

## 2020-08-26 MED ORDER — CARVEDILOL 3.125 MG PO TABS: 3.1250 mg | ORAL_TABLET | Freq: Two times a day (BID) | ORAL | 0 refills | Status: DC

## 2020-08-26 MED ORDER — ESCITALOPRAM OXALATE 10 MG PO TABS: 10.0000 mg | ORAL_TABLET | Freq: Every day | ORAL | 0 refills | Status: DC

## 2020-08-26 MED ORDER — LISINOPRIL 20 MG PO TABS: 20.0000 mg | ORAL_TABLET | Freq: Every day | ORAL | 0 refills | Status: DC

## 2020-08-26 MED ORDER — CARVEDILOL 3.125 MG PO TABS
3.1250 mg | ORAL_TABLET | Freq: Two times a day (BID) | ORAL | 1 refills | Status: DC
Start: 2020-08-26 — End: 2021-03-03

## 2020-08-26 MED ORDER — MELOXICAM 15 MG PO TABS: ORAL_TABLET | ORAL | 3 refills | Status: DC

## 2020-08-26 MED ORDER — ALLOPURINOL 300 MG PO TABS: 300.0000 mg | ORAL_TABLET | Freq: Every day | ORAL | 1 refills | Status: DC

## 2020-08-26 MED ORDER — ALLOPURINOL 300 MG PO TABS: 300.0000 mg | ORAL_TABLET | Freq: Every day | ORAL | 0 refills | Status: DC

## 2020-08-26 NOTE — Assessment & Plan Note (Signed)
This pleasant 50 year old male also has bilateral knee pain, right worse than left with mild swelling, medial joint line pain, gelling, no mechanical symptoms or recent trauma. Adding x-rays, switching to meloxicam, rehab exercises given, return to see me in a month, injection if no better.

## 2020-08-26 NOTE — Patient Instructions (Signed)

## 2020-08-26 NOTE — Assessment & Plan Note (Signed)
Completely resolved now after injection a year ago, allopurinol. Rechecking uric acid levels. Goal is less than 5.

## 2020-08-26 NOTE — Progress Notes (Signed)
    Procedures performed today:    None.  Independent interpretation of notes and tests performed by another provider:   None.  Brief History, Exam, Impression, and Recommendations:    Swelling of first metatarsophalangeal (MTP) joint of left foot Completely resolved now after injection a year ago, allopurinol. Rechecking uric acid levels. Goal is less than 5.  Primary osteoarthritis of both knees This pleasant 50 year old male also has bilateral knee pain, right worse than left with mild swelling, medial joint line pain, gelling, no mechanical symptoms or recent trauma. Adding x-rays, switching to meloxicam, rehab exercises given, return to see me in a month, injection if no better.    ___________________________________________ Gwen Her. Dianah Field, M.D., ABFM., CAQSM. Primary Care and [place] [employer] [facility]

## 2020-08-26 NOTE — Progress Notes (Signed)
HPI: Scott Joseph is a 50 y.o. 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  he presents to [facility] today, 08/26/20,  for chief complaint of: Annual physical exam  Dentist: Arlean Hopping, needs to go  Eye exam: done on Saturday, no change in vision, wears glasses Exercise: physically active job Diet: "neutral", drinking more water Colon cancer screening: done last year Prostate cancer screening: Today  COVID vaccine: done but no booster yet  Concerns: Bilateral knee pain ( Past medical, surgical, social and family history reviewed:  Patient Active Problem List   Diagnosis Date Noted  . Primary osteoarthritis of both knees 08/26/2020  . Swelling of first metatarsophalangeal (MTP) joint of left foot 07/30/2019  . Essential hypertension 07/30/2019  . Rheumatoid arthritis ([place]) 07/30/2019  . Anxiety 07/30/2019    Past Surgical History:  Procedure Laterality Date  . JOINT REPLACEMENT     Bilateral Hip Replacement  . PLEURAL SCARIFICATION      Social History   Tobacco Use  . Smoking status: Never Smoker  . Smokeless tobacco: Never Used  Substance Use Topics  . Alcohol use: Yes    Comment: Rarely    Family History  Problem Relation Age of Onset  . Hypertension Mother   . CAD Mother   . Hypertension Father   . Lung cancer Father   . Diabetes Sister   . Diabetes Brother   . Hypertension Maternal Grandmother   . Hypertension Maternal Grandfather   . Hypertension Paternal Grandmother   . Hypertension Paternal Grandfather   . Diabetes Paternal Grandfather   . Colon cancer Neg Hx   . Esophageal cancer Neg Hx   . Rectal cancer Neg Hx   . Stomach cancer Neg Hx      Current medication list and allergy/intolerance information reviewed:    Current Outpatient Medications  Medication Sig Dispense Refill  . Multiple  Vitamin (MULTIVITAMIN PO) Take by mouth daily.    Marland Kitchen allopurinol (ZYLOPRIM) 300 MG tablet Take 1 tablet (300 mg total) by mouth daily. 90 tablet 1  . amLODipine (NORVASC) 10 MG tablet Take 1 tablet (10 mg total) by mouth daily. 90 tablet 1  . carvedilol (COREG) 3.125 MG tablet Take 1 tablet (3.125 mg total) by mouth 2 (two) times daily. 180 tablet 1  . escitalopram (LEXAPRO) 10 MG tablet Take 1 tablet (10 mg total) by mouth daily. 90 tablet 1  . lisinopril (ZESTRIL) 20 MG tablet Take 1 tablet (20 mg total) by mouth daily. 90 tablet 1  . meloxicam (MOBIC) 15 MG tablet One tab PO qAM with a meal for 2 weeks, then daily prn pain. 30 tablet 3   No current facility-administered medications for this visit.    No Known Allergies    Review of Systems:  Constitutional:  No  fever, no chills, No recent illness, No unintentional weight changes. No significant fatigue.   HEENT: No  headache, no vision change, no hearing change, No sore throat, No  sinus pressure  Cardiac: No  chest pain, No  pressure, No palpitations, No  Orthopnea  Respiratory:  No  shortness of breath. No  Cough  Gastrointestinal: No  abdominal pain, No  nausea, No  vomiting,  No  blood in stool, No  diarrhea, No  constipation   Musculoskeletal: No new myalgia/arthralgia  Skin: No  Rash, No other wounds/concerning lesions  Genitourinary: No  incontinence,  No  abnormal genital bleeding, No abnormal genital discharge  Hem/Onc: No  easy bruising/bleeding, No  abnormal lymph node  Endocrine: No cold intolerance,  No heat intolerance. No polyuria/polydipsia/polyphagia   Neurologic: No  weakness, No  dizziness, No  slurred speech/focal weakness/facial droop  Psychiatric: No  concerns with depression, No  concerns with anxiety, No sleep problems, No mood problems  Exam:  BP (!) 147/89   Pulse 78   Temp 98.4 F (36.9 C)   Ht 5' 2.25" (1.581 m)   Wt 156 lb 4.8 oz (70.9 kg)   SpO2 98%   BMI 28.36 kg/m   Constitutional:  VS see above. General Appearance: alert, well-developed, well-nourished, NAD  Eyes: Normal lids and conjunctive, non-icteric sclera  Ears, Nose, Mouth, Throat: MMM, Normal external inspection ears/nares/mouth/lips/gums. TM normal bilaterally.    Neck: No masses, trachea midline. No thyroid enlargement. No tenderness/mass appreciated. No lymphadenopathy  Respiratory: Normal respiratory effort. no wheeze, no rhonchi, no rales  Cardiovascular: S1/S2 normal, no murmur, no rub/gallop auscultated. RRR. No lower extremity edema. Pedal pulse II/IV bilaterally PT. No carotid bruit or JVD. No abdominal aortic bruit.  Gastrointestinal: Nontender, no masses. No hepatomegaly, no splenomegaly. No hernia appreciated. Bowel sounds normal. Rectal exam deferred.   Musculoskeletal: Gait normal. No clubbing/cyanosis of digits.   Neurological: Normal balance/coordination. No tremor. No cranial nerve deficit on limited exam. Motor and sensation intact and symmetric. Cerebellar reflexes intact.   Skin: warm, dry, intact. No rash/ulcer. No concerning nevi or subq nodules on limited exam.    Psychiatric: Normal judgment/insight. Normal mood and affect. Oriented x3.    No results found for this or any previous visit (from the past 72 hour(s)).  No results found.   ASSESSMENT/PLAN:   1. Annual physical exam Checking CBC, CMP, and Lipid panel - CBC with Differential/Platelet - COMPLETE METABOLIC PANEL WITH GFR - Lipid panel  2. Swelling of first metatarsophalangeal (MTP) joint of left foot Continue allopurinol. Checking uric acid.  - allopurinol (ZYLOPRIM) 300 MG tablet; Take 1 tablet (300 mg total) by mouth daily.  Dispense: 90 tablet; Refill: 1  3. Essential hypertension Checking labs. Continue Amlodipine, Coreg, and Lisinopril as prescribed. Refills provided. Reduce dietary sodium and continue to monitor BP at home with goal of 130/80 or less. If consistently higher, return for evaluation and likely  medication regimen changes.  - CBC with Differential/Platelet - COMPLETE METABOLIC PANEL WITH GFR - Lipid panel - amLODipine (NORVASC) 10 MG tablet; Take 1 tablet (10 mg total) by mouth daily.  Dispense: 90 tablet; Refill: 1 - carvedilol (COREG) 3.125 MG tablet; Take 1 tablet (3.125 mg total) by mouth 2 (two) times daily.  Dispense: 180 tablet; Refill: 1 - lisinopril (ZESTRIL) 20 MG tablet; Take 1 tablet (20 mg total) by mouth daily.  Dispense: 90 tablet; Refill: 1  4. Anxiety Continue  Lexapro as prescribed.  - escitalopram (LEXAPRO) 10 MG tablet; Take 1 tablet (10 mg total) by mouth daily.  Dispense: 90 tablet; Refill: 1  5. Prostate cancer screening Checking PSA.  - PSA  6. Need for influenza vaccination Flu vaccine given in office.  - Flu Vaccine QUAD 36+ mos IM  7. Need for hepatitis C screening test Discussed screening recommendations. Patient agreeable so adding to blood work today.  - Hepatitis C antibody  8. Screening for endocrine disorder Checking TSH.  - TSH  Orders Placed This Encounter  Procedures  . Flu Vaccine QUAD 36+ mos IM  . CBC with Differential/Platelet  .  COMPLETE METABOLIC PANEL WITH GFR  . Lipid panel  . TSH  . Hepatitis C antibody  . PSA    Meds ordered this encounter  Medications  . DISCONTD: allopurinol (ZYLOPRIM) 300 MG tablet    Sig: Take 1 tablet (300 mg total) by mouth daily. appt for refills    Dispense:  30 tablet    Refill:  0    Order Specific Question:   Supervising Provider    Answer:   Emeterio Reeve [id number]  . DISCONTD: amLODipine (NORVASC) 10 MG tablet    Sig: Take 1 tablet (10 mg total) by mouth daily. **PATIENT NEEDS OFFICE VISIT FOR ADDITIONAL REFILLS**    Dispense:  15 tablet    Refill:  0    Order Specific Question:   Supervising Provider    Answer:   Emeterio Reeve [id number]  . DISCONTD: carvedilol (COREG) 3.125 MG tablet    Sig: Take 1 tablet (3.125 mg total) by mouth 2 (two) times daily. appt for refills     Dispense:  60 tablet    Refill:  0    Order Specific Question:   Supervising Provider    Answer:   Emeterio Reeve [id number]  . DISCONTD: escitalopram (LEXAPRO) 10 MG tablet    Sig: Take 1 tablet (10 mg total) by mouth daily. appt for refills    Dispense:  30 tablet    Refill:  0    Order Specific Question:   Supervising Provider    Answer:   Emeterio Reeve [id number]  . DISCONTD: lisinopril (ZESTRIL) 20 MG tablet    Sig: Take 1 tablet (20 mg total) by mouth daily. appt for refills    Dispense:  30 tablet    Refill:  0    Order Specific Question:   Supervising Provider    Answer:   Emeterio Reeve [id number]  . allopurinol (ZYLOPRIM) 300 MG tablet    Sig: Take 1 tablet (300 mg total) by mouth daily.    Dispense:  90 tablet    Refill:  1    Order Specific Question:   Supervising Provider    Answer:   Emeterio Reeve [id number]  . amLODipine (NORVASC) 10 MG tablet    Sig: Take 1 tablet (10 mg total) by mouth daily.    Dispense:  90 tablet    Refill:  1    Order Specific Question:   Supervising Provider    Answer:   Emeterio Reeve [id number]  . carvedilol (COREG) 3.125 MG tablet    Sig: Take 1 tablet (3.125 mg total) by mouth 2 (two) times daily.    Dispense:  180 tablet    Refill:  1    Order Specific Question:   Supervising Provider    Answer:   Emeterio Reeve [id number]  . escitalopram (LEXAPRO) 10 MG tablet    Sig: Take 1 tablet (10 mg total) by mouth daily.    Dispense:  90 tablet    Refill:  1    Order Specific Question:   Supervising Provider    Answer:   Emeterio Reeve [id number]  . lisinopril (ZESTRIL) 20 MG tablet    Sig: Take 1 tablet (20 mg total) by mouth daily.    Dispense:  90 tablet    Refill:  1    Order Specific Question:   Supervising Provider    Answer:   Emeterio Reeve [[id number]]    Patient Instructions   Preventive Care 16-71 Years Old, Male Preventive care refers to lifestyle choices and  visits with your health care  provider that can promote health and wellness. This includes:  A yearly physical exam. This is also called an annual wellness visit.  Regular dental and eye exams.  Immunizations.  Screening for certain conditions.  Healthy lifestyle choices, such as: ? Eating a healthy diet. ? Getting regular exercise. ? Not using drugs or products that contain nicotine and tobacco. ? Limiting alcohol use. What can I expect for my preventive care visit? Physical exam Your health care provider will check your:  Height and weight. These may be used to calculate your BMI (body mass index). BMI is a measurement that tells if you are at a healthy weight.  Heart rate and blood pressure.  Body temperature.  Skin for abnormal spots. Counseling Your health care provider may ask you questions about your:  Past medical problems.  Family's medical history.  Alcohol, tobacco, and drug use.  Emotional well-being.  Home life and relationship well-being.  Sexual activity.  Diet, exercise, and sleep habits.  Work and work Statistician.  Access to firearms. What immunizations do I need? Vaccines are usually given at various ages, according to a schedule. Your health care provider will recommend vaccines for you based on your age, medical history, and lifestyle or other factors, such as travel or where you work.   What tests do I need? Blood tests  Lipid and cholesterol levels. These may be checked every 5 years, or more often if you are over 6 years old.  Hepatitis C test.  Hepatitis B test. Screening  Lung cancer screening. You may have this screening every year starting at age 46 if you have a 30-pack-year history of smoking and currently smoke or have quit within the past 15 years.  Prostate cancer screening. Recommendations will vary depending on your family history and other risks.  Genital exam to check for testicular cancer or hernias.  Colorectal cancer screening. ? All adults  should have this screening starting at age 45 and continuing until age 67. ? Your health care provider may recommend screening at age 81 if you are at increased risk. ? You will have tests every 1-10 years, depending on your results and the type of screening test.  Diabetes screening. ? This is done by checking your blood sugar (glucose) after you have not eaten for a while (fasting). ? You may have this done every 1-3 years.  STD (sexually transmitted disease) testing, if you are at risk. Follow these instructions at home: Eating and drinking  Eat a diet that includes fresh fruits and vegetables, whole grains, lean protein, and low-fat dairy products.  Take vitamin and mineral supplements as recommended by your health care provider.  Do not drink alcohol if your health care provider tells you not to drink.  If you drink alcohol: ? Limit how much you have to 0-2 drinks a day. ? Be aware of how much alcohol is in your drink. In the [place] one drink equals one 12 oz bottle of beer (355 mL), one 5 oz glass of wine (148 mL), or one 1 oz glass of hard liquor (44 mL).   Lifestyle  Take daily care of your teeth and gums. Brush your teeth every morning and night with fluoride toothpaste. Floss one time each day.  Stay active. Exercise for at least 30 minutes 5 or more days each week.  Do not use any products that contain nicotine or tobacco, such as cigarettes, e-cigarettes, and chewing tobacco. If you need help  quitting, ask your health care provider.  Do not use drugs.  If you are sexually active, practice safe sex. Use a condom or other form of protection to prevent STIs (sexually transmitted infections).  If told by your health care provider, take low-dose aspirin daily starting at age 3.  Find healthy ways to cope with stress, such as: ? Meditation, yoga, or listening to music. ? Journaling. ? Talking to a trusted person. ? Spending time with friends and family. Safety  Always  wear your seat belt while driving or riding in a vehicle.  Do not drive: ? If you have been drinking alcohol. Do not ride with someone who has been drinking. ? When you are tired or distracted. ? While texting.  Wear a helmet and other protective equipment during sports activities.  If you have firearms in your house, make sure you follow all gun safety procedures. What's next?  Go to your health care provider once a year for an annual wellness visit.  Ask your health care provider how often you should have your eyes and teeth checked.  Stay up to date on all vaccines. This information is not intended to replace advice given to you by your health care provider. Make sure you discuss any questions you have with your health care provider. Document Revised: 03/27/2019 Document Reviewed: 06/22/2018 Elsevier Patient Education  2021 [place].    Follow-up plan: Return in about 6 months (around 02/23/2021) for chronic disease follow up.  Clearnce Sorrel, DNP, APRN, FNP-BC [facility]

## 2020-08-27 LAB — CBC WITH DIFFERENTIAL/PLATELET
Absolute Monocytes: 735 cells/uL (ref 200–950)
Basophils Absolute: 79 cells/uL (ref 0–200)
Basophils Relative: 1 %
Eosinophils Absolute: 158 cells/uL (ref 15–500)
Eosinophils Relative: 2 %
HCT: 45.7 % (ref 38.5–50.0)
Hemoglobin: 15.5 g/dL (ref 13.2–17.1)
Lymphs Abs: 1801 cells/uL (ref 850–3900)
MCH: 30.1 pg (ref 27.0–33.0)
MCHC: 33.9 g/dL (ref 32.0–36.0)
MCV: 88.7 fL (ref 80.0–100.0)
MPV: 11.9 fL (ref 7.5–12.5)
Monocytes Relative: 9.3 %
Neutro Abs: 5127 cells/uL (ref 1500–7800)
Neutrophils Relative %: 64.9 %
Platelets: 488 10*3/uL — ABNORMAL HIGH (ref 140–400)
RBC: 5.15 10*6/uL (ref 4.20–5.80)
RDW: 15.4 % — ABNORMAL HIGH (ref 11.0–15.0)
Total Lymphocyte: 22.8 %
WBC: 7.9 10*3/uL (ref 3.8–10.8)

## 2020-08-27 LAB — COMPLETE METABOLIC PANEL WITH GFR
AG Ratio: 1.7 (calc) (ref 1.0–2.5)
ALT: 16 U/L (ref 9–46)
AST: 16 U/L (ref 10–35)
Albumin: 4.7 g/dL (ref 3.6–5.1)
Alkaline phosphatase (APISO): 70 U/L (ref 35–144)
BUN/Creatinine Ratio: 16 (calc) (ref 6–22)
BUN: 23 mg/dL (ref 7–25)
CO2: 31 mmol/L (ref 20–32)
Calcium: 10.7 mg/dL — ABNORMAL HIGH (ref 8.6–10.3)
Chloride: 103 mmol/L (ref 98–110)
Creat: 1.45 mg/dL — ABNORMAL HIGH (ref 0.70–1.33)
GFR, Est African American: 64 mL/min/{1.73_m2} (ref >=60)
GFR, Est Non African American: 55 mL/min/{1.73_m2} — ABNORMAL LOW (ref >=60)
Globulin: 2.7 g/dL (calc) (ref 1.9–3.7)
Glucose, Bld: 91 mg/dL (ref 65–99)
Potassium: 4.5 mmol/L (ref 3.5–5.3)
Sodium: 140 mmol/L (ref 135–146)
Total Bilirubin: 0.6 mg/dL (ref 0.2–1.2)
Total Protein: 7.4 g/dL (ref 6.1–8.1)

## 2020-08-27 LAB — LIPID PANEL
Cholesterol: 271 mg/dL — ABNORMAL HIGH (ref <200)
HDL: 58 mg/dL (ref >=40)
LDL Cholesterol (Calc): 172 mg/dL (calc) — ABNORMAL HIGH
Non-HDL Cholesterol (Calc): 213 mg/dL (calc) — ABNORMAL HIGH (ref <130)
Total CHOL/HDL Ratio: 4.7 (calc) (ref <5.0)
Triglycerides: 239 mg/dL — ABNORMAL HIGH (ref <150)

## 2020-08-27 LAB — HEPATITIS C ANTIBODY
Hepatitis C Ab: NONREACTIVE
SIGNAL TO CUT-OFF: 0.01 (ref <1.00)

## 2020-08-27 LAB — PSA: PSA: 1.27 ng/mL (ref <=4.0)

## 2020-08-27 LAB — TSH: TSH: 0.71 mIU/L (ref 0.40–4.50)

## 2020-08-27 LAB — URIC ACID: Uric Acid, Serum: 4.7 mg/dL (ref 4.0–8.0)

## 2020-08-27 MED ORDER — ATORVASTATIN CALCIUM 20 MG PO TABS: 20.0000 mg | ORAL_TABLET | Freq: Every day | ORAL | 3 refills | Status: DC

## 2020-08-27 NOTE — Addendum Note (Signed)
Addended bySamuel Bouche on: 08/27/2020 08:09 AM   Modules accepted: Orders

## 2020-09-23 ENCOUNTER — Ambulatory Visit: Payer: Managed Care, Other (non HMO) | Admitting: Sports Medicine

## 2021-02-22 DIAGNOSIS — M109 Gout, unspecified: Secondary | ICD-10-CM | POA: Insufficient documentation

## 2021-02-22 DIAGNOSIS — E782 Mixed hyperlipidemia: Secondary | ICD-10-CM | POA: Insufficient documentation

## 2021-02-22 NOTE — Progress Notes (Signed)
No show for appointment. Patient called shortly after with news that his child is in the hospital. Appointment cancelled and he will reschedule.   Clearnce Sorrel, DNP, APRN, FNP-BC [facility]'

## 2021-02-23 ENCOUNTER — Telehealth: Payer: Self-pay | Admitting: Medical-Surgical

## 2021-02-23 ENCOUNTER — Encounter: Payer: Managed Care, Other (non HMO) | Admitting: Medical-Surgical

## 2021-02-23 DIAGNOSIS — M069 Rheumatoid arthritis, unspecified: Secondary | ICD-10-CM

## 2021-02-23 DIAGNOSIS — M170 Bilateral primary osteoarthritis of knee: Secondary | ICD-10-CM

## 2021-02-23 DIAGNOSIS — M25475 Effusion, left foot: Secondary | ICD-10-CM

## 2021-02-23 DIAGNOSIS — F419 Anxiety disorder, unspecified: Secondary | ICD-10-CM

## 2021-02-23 DIAGNOSIS — E782 Mixed hyperlipidemia: Secondary | ICD-10-CM

## 2021-02-23 DIAGNOSIS — I10 Essential (primary) hypertension: Secondary | ICD-10-CM

## 2021-02-23 DIAGNOSIS — M109 Gout, unspecified: Secondary | ICD-10-CM

## 2021-02-23 NOTE — Telephone Encounter (Signed)
Appointment was missed due to child in hospital.

## 2021-03-03 ENCOUNTER — Ambulatory Visit (INDEPENDENT_AMBULATORY_CARE_PROVIDER_SITE_OTHER): Payer: Managed Care, Other (non HMO) | Admitting: Medical-Surgical

## 2021-03-03 ENCOUNTER — Other Ambulatory Visit: Payer: Self-pay

## 2021-03-03 ENCOUNTER — Encounter: Payer: Self-pay | Admitting: Medical-Surgical

## 2021-03-03 VITALS — BP 158/129 | HR 62 | Resp 20 | Ht 62.25 in | Wt 158.0 lb

## 2021-03-03 DIAGNOSIS — M109 Gout, unspecified: Secondary | ICD-10-CM

## 2021-03-03 DIAGNOSIS — M069 Rheumatoid arthritis, unspecified: Secondary | ICD-10-CM

## 2021-03-03 DIAGNOSIS — M170 Bilateral primary osteoarthritis of knee: Secondary | ICD-10-CM

## 2021-03-03 DIAGNOSIS — N289 Disorder of kidney and ureter, unspecified: Secondary | ICD-10-CM

## 2021-03-03 DIAGNOSIS — I10 Essential (primary) hypertension: Secondary | ICD-10-CM

## 2021-03-03 DIAGNOSIS — M25475 Effusion, left foot: Secondary | ICD-10-CM

## 2021-03-03 DIAGNOSIS — M7662 Achilles tendinitis, left leg: Secondary | ICD-10-CM

## 2021-03-03 DIAGNOSIS — E782 Mixed hyperlipidemia: Secondary | ICD-10-CM

## 2021-03-03 DIAGNOSIS — F419 Anxiety disorder, unspecified: Secondary | ICD-10-CM | POA: Diagnosis not present

## 2021-03-03 DIAGNOSIS — R0982 Postnasal drip: Secondary | ICD-10-CM

## 2021-03-03 MED ORDER — ESCITALOPRAM OXALATE 10 MG PO TABS: 10.0000 mg | ORAL_TABLET | Freq: Every day | ORAL | 1 refills | Status: DC

## 2021-03-03 MED ORDER — ATORVASTATIN CALCIUM 20 MG PO TABS: 20.0000 mg | ORAL_TABLET | Freq: Every day | ORAL | 3 refills | Status: DC

## 2021-03-03 MED ORDER — MELOXICAM 15 MG PO TABS: ORAL_TABLET | ORAL | 3 refills | Status: DC

## 2021-03-03 MED ORDER — LISINOPRIL 20 MG PO TABS: 20.0000 mg | ORAL_TABLET | Freq: Every day | ORAL | 1 refills | Status: DC

## 2021-03-03 MED ORDER — FLUTICASONE PROPIONATE 50 MCG/ACT NA SUSP: 2.0000 | Freq: Every day | NASAL | 6 refills | Status: DC

## 2021-03-03 MED ORDER — CETIRIZINE HCL 10 MG PO TABS: 10.0000 mg | ORAL_TABLET | Freq: Every day | ORAL | 3 refills | Status: DC

## 2021-03-03 MED ORDER — ALLOPURINOL 300 MG PO TABS: 300.0000 mg | ORAL_TABLET | Freq: Every day | ORAL | 1 refills | Status: DC

## 2021-03-03 MED ORDER — AMLODIPINE BESYLATE 10 MG PO TABS: 10.0000 mg | ORAL_TABLET | Freq: Every day | ORAL | 1 refills | Status: DC

## 2021-03-03 MED ORDER — CARVEDILOL 3.125 MG PO TABS: 3.1250 mg | ORAL_TABLET | Freq: Two times a day (BID) | ORAL | 1 refills | Status: DC

## 2021-03-03 NOTE — Progress Notes (Signed)
HPI with pertinent ROS:   CC: HTN/gout follow up  HPI: Pleasant 50 year old male presenting today for the following:  HTN- Previously taking Amlodipine '10mg'$  daily, Lisinopril '20mg'$  daily, and Carvedilol 3.'125mg'$  twice daily but ran out of Amlodipine. Not checking BP at home regularly. Usually tolerates all of his medications well without side effects. Denies CP, SOB, palpitations, lower extremity edema, dizziness, headaches, or vision changes.  Gout- taking Allopurinol '300mg'$  daily as prescribed, tolerating well without side effects. No recent flares.   HLD- taking Atorvastatin '20mg'$  daily but ran  out of this medication while trying to get in for a follow up. Tolerates the medication well without side effects. No muscle tenderness, myalgias, weakness, or dark urine.   Left heel/ankle pain- has been working extended hours at work requiring a lot of time on his feet. The back of his left heel up towards the ankle and calf are very sore and tender. He has tried wearing different shoes but nothing seems to help after a long day. He did get some relief with massage of the area. Also notes the meloxicam helps some.   Hemorrhoids- has had trouble with external hemorrhoids since his colonoscopy. Intermittently has itching and irritation around the rectal area. Has occasionally seen a spot of bright red blood on the tissue but none on the stool or in the water. Has not use any OTC options to treat them.   Mood- taking Lexapro '10mg'$  daily, tolerating well without side effects. Feels the medication is working well to help with his mood. Some of his stressors revolve around his job at this point but overall he feels his mood is stable. Does note sexual side effects but would like to stay away from adding a medication with his HTN history. Has not tried non-medicinal options such as a ring or a pump.   Has had some issues with chronic throat clearing and feels this is related to sinus drainage. Not currently  taking anything to treat this but interested in options.   I reviewed the past medical history, family history, social history, surgical history, and allergies today and no changes were needed.  Please see the problem list section below in epic for further details.   Physical exam:   General: Well Developed, well nourished, and in no acute distress.  Neuro: Alert and oriented x3.  HEENT: Normocephalic, atraumatic.  Skin: Warm and dry. Cardiac: Regular rate and rhythm, no murmurs rubs or gallops, no lower extremity edema.  Respiratory: Clear to auscultation bilaterally. Not using accessory muscles, speaking in full sentences.  Impression and Recommendations:    1. Essential hypertension Resume amlodipine.  Continue carvedilol and lisinopril as ordered.  Checking CBC with differential and CMP today. - amLODipine (NORVASC) 10 MG tablet; Take 1 tablet (10 mg total) by mouth daily.  Dispense: 90 tablet; Refill: 1 - carvedilol (COREG) 3.125 MG tablet; Take 1 tablet (3.125 mg total) by mouth 2 (two) times daily.  Dispense: 180 tablet; Refill: 1 - lisinopril (ZESTRIL) 20 MG tablet; Take 1 tablet (20 mg total) by mouth daily.  Dispense: 90 tablet; Refill: 1 - CBC with Differential/Platelet - COMPLETE METABOLIC PANEL WITH GFR  2. Gout of foot, unspecified cause, unspecified chronicity, unspecified laterality Continue allopurinol 300 mg daily.  Checking uric acid. - Uric acid  3. Anxiety Mood stable.  Continue Lexapro 10 mg daily.  Discussed nonmedicinal options for sexual side effects.  Patient will check these out and let me know if he should decide to try medication. -  escitalopram (LEXAPRO) 10 MG tablet; Take 1 tablet (10 mg total) by mouth daily.  Dispense: 90 tablet; Refill: 1  4. Mixed hyperlipidemia Checking lipid panel today.  Continue the atorvastatin 20 mg daily. - Lipid panel  5. Primary osteoarthritis of both knees Continue meloxicam daily as needed. - meloxicam (MOBIC) 15 MG  tablet; One tab PO qAM with a meal for 2 weeks, then daily prn pain.  Dispense: 90 tablet; Refill: 3  6. Rheumatoid arthritis, involving unspecified site, unspecified whether rheumatoid factor present (HCC) Continue meloxicam daily as needed.  7. Swelling of first metatarsophalangeal (MTP) joint of left foot Continue allopurinol. - allopurinol (ZYLOPRIM) 300 MG tablet; Take 1 tablet (300 mg total) by mouth daily.  Dispense: 90 tablet; Refill: 1  8. Post-nasal drip Start cetirizine 10 mg daily and Flonase 2 sprays each nostril once daily.  9. Achilles tendinitis of left lower extremity Okay to treat with meloxicam and Tylenol.  Discussed use of heat and ice.  Printed stretches provided for patient.  Heel lifts provided with instructions to use for both feet shoes rather than only one at the time.  Return in about 2 weeks (around 03/17/2021) for nurse visit for BP check. ___________________________________________ Clearnce Sorrel, DNP, APRN, FNP-BC [facility] [place]

## 2021-03-04 ENCOUNTER — Encounter: Payer: Self-pay | Admitting: Medical-Surgical

## 2021-03-04 ENCOUNTER — Other Ambulatory Visit: Payer: Self-pay | Admitting: Medical-Surgical

## 2021-03-04 LAB — LIPID PANEL
Cholesterol: 226 mg/dL — ABNORMAL HIGH (ref <200)
HDL: 46 mg/dL (ref >=40)
LDL Cholesterol (Calc): 157 mg/dL (calc) — ABNORMAL HIGH
Non-HDL Cholesterol (Calc): 180 mg/dL (calc) — ABNORMAL HIGH (ref <130)
Total CHOL/HDL Ratio: 4.9 (calc) (ref <5.0)
Triglycerides: 114 mg/dL (ref <150)

## 2021-03-04 LAB — CBC WITH DIFFERENTIAL/PLATELET
Absolute Monocytes: 592 cells/uL (ref 200–950)
Basophils Absolute: 70 cells/uL (ref 0–200)
Basophils Relative: 0.8 %
Eosinophils Absolute: 174 cells/uL (ref 15–500)
Eosinophils Relative: 2 %
HCT: 42.7 % (ref 38.5–50.0)
Hemoglobin: 14.4 g/dL (ref 13.2–17.1)
Lymphs Abs: 1479 cells/uL (ref 850–3900)
MCH: 30.6 pg (ref 27.0–33.0)
MCHC: 33.7 g/dL (ref 32.0–36.0)
MCV: 90.7 fL (ref 80.0–100.0)
MPV: 11.9 fL (ref 7.5–12.5)
Monocytes Relative: 6.8 %
Neutro Abs: 6386 cells/uL (ref 1500–7800)
Neutrophils Relative %: 73.4 %
Platelets: 422 10*3/uL — ABNORMAL HIGH (ref 140–400)
RBC: 4.71 10*6/uL (ref 4.20–5.80)
RDW: 14.5 % (ref 11.0–15.0)
Total Lymphocyte: 17 %
WBC: 8.7 10*3/uL (ref 3.8–10.8)

## 2021-03-04 LAB — COMPLETE METABOLIC PANEL WITH GFR
AG Ratio: 1.6 (calc) (ref 1.0–2.5)
ALT: 11 U/L (ref 9–46)
AST: 13 U/L (ref 10–35)
Albumin: 4.3 g/dL (ref 3.6–5.1)
Alkaline phosphatase (APISO): 69 U/L (ref 35–144)
BUN/Creatinine Ratio: 12 (calc) (ref 6–22)
BUN: 21 mg/dL (ref 7–25)
CO2: 32 mmol/L (ref 20–32)
Calcium: 9.9 mg/dL (ref 8.6–10.3)
Chloride: 106 mmol/L (ref 98–110)
Creat: 1.79 mg/dL — ABNORMAL HIGH (ref 0.70–1.30)
Globulin: 2.7 g/dL (calc) (ref 1.9–3.7)
Glucose, Bld: 96 mg/dL (ref 65–99)
Potassium: 4.9 mmol/L (ref 3.5–5.3)
Sodium: 141 mmol/L (ref 135–146)
Total Bilirubin: 0.6 mg/dL (ref 0.2–1.2)
Total Protein: 7 g/dL (ref 6.1–8.1)
eGFR: 45 mL/min/{1.73_m2} — ABNORMAL LOW (ref >=60)

## 2021-03-04 LAB — URIC ACID: Uric Acid, Serum: 5.5 mg/dL (ref 4.0–8.0)

## 2021-03-04 MED ORDER — ATORVASTATIN CALCIUM 40 MG PO TABS: 40.0000 mg | ORAL_TABLET | Freq: Every day | ORAL | 1 refills | Status: DC

## 2021-03-04 NOTE — Addendum Note (Signed)
Addended bySamuel Bouche on: 03/04/2021 07:45 AM   Modules accepted: Orders

## 2021-03-17 ENCOUNTER — Ambulatory Visit: Payer: Managed Care, Other (non HMO)

## 2021-07-29 ENCOUNTER — Ambulatory Visit: Payer: Managed Care, Other (non HMO) | Admitting: Sports Medicine

## 2021-08-13 ENCOUNTER — Other Ambulatory Visit: Payer: Self-pay

## 2021-08-13 ENCOUNTER — Ambulatory Visit (INDEPENDENT_AMBULATORY_CARE_PROVIDER_SITE_OTHER): Payer: Managed Care, Other (non HMO)

## 2021-08-13 ENCOUNTER — Ambulatory Visit (INDEPENDENT_AMBULATORY_CARE_PROVIDER_SITE_OTHER): Payer: Managed Care, Other (non HMO) | Admitting: Sports Medicine

## 2021-08-13 DIAGNOSIS — M170 Bilateral primary osteoarthritis of knee: Secondary | ICD-10-CM | POA: Diagnosis not present

## 2021-08-13 NOTE — Assessment & Plan Note (Signed)
Pleasant 51 year old male, bilateral knee pain, right worse than left, treated last year, meloxicam helps to some degree, now having recurrence of pain and swelling, aspiration and injection today. Turn to see me in a month.

## 2021-08-13 NOTE — Progress Notes (Signed)
° ° °  Procedures performed today:    Procedure: Real-time Ultrasound Guided aspiration/injection right knee Device: Samsung HS60  Verbal informed consent obtained.  Time-out conducted.  Noted no overlying erythema, induration, or other signs of local infection.  Skin prepped in a sterile fashion.  Local anesthesia: Topical Ethyl chloride.  With sterile technique and under real time ultrasound guidance: Noted effusion, using an 18-gauge needle aspirated 20 mils of clear, straw-colored fluid, syringe switched and 1 cc Kenalog 40, 2 cc lidocaine, 2 cc bupivacaine injected easily Completed without difficulty  Advised to call if fevers/chills, erythema, induration, drainage, or persistent bleeding.  Images permanently stored and available for review in PACS.  Impression: Technically successful ultrasound guided aspiration/injection.  Independent interpretation of notes and tests performed by another provider:   None.  Brief History, Exam, Impression, and Recommendations:    Primary osteoarthritis of both knees Pleasant 51 year old male, bilateral knee pain, right worse than left, treated last year, meloxicam helps to some degree, now having recurrence of pain and swelling, aspiration and injection today. Turn to see me in a month.    ___________________________________________ Gwen Her. Dianah Field, M.D., ABFM., CAQSM. [facility] [place] Instructor of [place] [employer]

## 2021-08-31 NOTE — Progress Notes (Addendum)
°  HPI with pertinent ROS:   CC: Sleep study, Shingles vaccine  HPI: Pleasant 51 year old male presenting today for the following:HTN, shingles vaccine, sleep study.  HTN- States he has been out of blood pressure medication and has not taken his medication on a regular basis. Took Amlodipine this morning but not carvedilol or lisinopril. Has not been taking blood pressure at home, states he needs a new cuff. Denies SOB, chest pain, palpitations.    Would like a Sleep study due to his snoring. States he has a history of sleep apnea in 2017 and used a cpap, but turned it back in. Sometimes feels tired/not rested during the day  STOP-BANG for SLEEP APNEA Do you Snore loudly? Yes Do you often feel Tired during day? No Has anyone Observed you stop breathing? Yes History of high blood Pressure? Yes BMI >35? No Age >50? Yes Neck circumference >16 in? No Gender male? Yes 5-8 = high risk 3-4 = intermediate 0-2 = low risk    I reviewed the past medical history, family history, social history, surgical history, and allergies today and no changes were needed.  Please see the problem list section below in epic for further details.   Physical exam:   General: Well Developed, well nourished, and in no acute distress.  Neuro: Alert and oriented x3.  HEENT: Normocephalic, atraumatic.  Skin: Warm and dry. Cardiac: Regular rate and rhythm, no murmurs rubs or gallops, no lower extremity edema.  Respiratory: Clear to auscultation bilaterally. Not using accessory muscles, speaking in full sentences.  Impression and Recommendations:    1. Essential hypertension Elevated in office today, has not taken his blood pressure medication. Will refill medications today.  Encouraged to take medication as prescribed daily, limit sodium intake and increase exercise.  Encouraged to check blood pressure at home and check in in two weeks to assess blood pressure and possible need to change medication regimen.    2. Need for shingles vaccine Administered Shingrix in office today  3. Swelling of first metatarsophalangeal (MTP) joint of left foot No concerns at this time. Refill medication today. - allopurinol (ZYLOPRIM) 300 MG tablet; Take 1 tablet (300 mg total) by mouth daily.  Dispense: 90 tablet; Refill: 1  4. Kidney function abnormal Abnormal at last visit. Rechecking lab work today.  - BASIC METABOLIC PANEL WITH GFR  5. Loud snoring History of sleep apnea with cpap. Scored high risk on STOP-BANG, will place referral for sleep study.  Encouraged to sleep elevated with wedge support or side sleep with pillow in between legs. Encouraged weight loss to healthy weight to aid with snoring. - Home sleep test; Future     Return in about 2 weeks (around 09/15/2021) for nurse visit for BP check. ___________________________________________ Jeanann Lewandowsky, Student NP

## 2021-09-01 ENCOUNTER — Ambulatory Visit (INDEPENDENT_AMBULATORY_CARE_PROVIDER_SITE_OTHER): Payer: Managed Care, Other (non HMO) | Admitting: Medical-Surgical

## 2021-09-01 ENCOUNTER — Encounter: Payer: Self-pay | Admitting: Medical-Surgical

## 2021-09-01 ENCOUNTER — Other Ambulatory Visit: Payer: Self-pay

## 2021-09-01 VITALS — BP 155/110 | HR 99 | Resp 20 | Ht 62.25 in | Wt 164.1 lb

## 2021-09-01 DIAGNOSIS — R0683 Snoring: Secondary | ICD-10-CM | POA: Diagnosis not present

## 2021-09-01 DIAGNOSIS — I10 Essential (primary) hypertension: Secondary | ICD-10-CM

## 2021-09-01 DIAGNOSIS — Z23 Encounter for immunization: Secondary | ICD-10-CM | POA: Diagnosis not present

## 2021-09-01 DIAGNOSIS — M25475 Effusion, left foot: Secondary | ICD-10-CM | POA: Diagnosis not present

## 2021-09-01 DIAGNOSIS — N289 Disorder of kidney and ureter, unspecified: Secondary | ICD-10-CM

## 2021-09-01 LAB — BASIC METABOLIC PANEL WITH GFR
BUN/Creatinine Ratio: 16 (calc) (ref 6–22)
BUN: 25 mg/dL (ref 7–25)
CO2: 30 mmol/L (ref 20–32)
Calcium: 10.2 mg/dL (ref 8.6–10.3)
Chloride: 103 mmol/L (ref 98–110)
Creat: 1.59 mg/dL — ABNORMAL HIGH (ref 0.70–1.30)
Glucose, Bld: 95 mg/dL (ref 65–99)
Potassium: 4.3 mmol/L (ref 3.5–5.3)
Sodium: 141 mmol/L (ref 135–146)
eGFR: 52 mL/min/{1.73_m2} — ABNORMAL LOW (ref >=60)

## 2021-09-01 MED ORDER — ATORVASTATIN CALCIUM 40 MG PO TABS: 40.0000 mg | ORAL_TABLET | Freq: Every day | ORAL | 1 refills | Status: DC

## 2021-09-01 MED ORDER — CARVEDILOL 3.125 MG PO TABS: 3.1250 mg | ORAL_TABLET | Freq: Two times a day (BID) | ORAL | 1 refills | Status: DC

## 2021-09-01 MED ORDER — LISINOPRIL 20 MG PO TABS: 20.0000 mg | ORAL_TABLET | Freq: Every day | ORAL | 1 refills | Status: DC

## 2021-09-01 MED ORDER — ALLOPURINOL 300 MG PO TABS: 300.0000 mg | ORAL_TABLET | Freq: Every day | ORAL | 1 refills | Status: DC

## 2021-09-01 MED ORDER — AMLODIPINE BESYLATE 10 MG PO TABS: 10.0000 mg | ORAL_TABLET | Freq: Every day | ORAL | 1 refills | Status: DC

## 2021-09-01 NOTE — Progress Notes (Signed)
Medical screening examination/treatment was performed by qualified nurse practitioner student and as supervising provider I was immediately available for consultation/collaboration. I have reviewed documentation and agree with assessment and plan. ° °Mairi Stagliano L. Sascha Baugher, DNP, APRN, FNP-BC °[facility] °Primary Care and Sports Medicine ° °

## 2021-09-10 ENCOUNTER — Ambulatory Visit (INDEPENDENT_AMBULATORY_CARE_PROVIDER_SITE_OTHER): Payer: Managed Care, Other (non HMO) | Admitting: Sports Medicine

## 2021-09-10 ENCOUNTER — Other Ambulatory Visit: Payer: Self-pay

## 2021-09-10 DIAGNOSIS — M25561 Pain in right knee: Secondary | ICD-10-CM | POA: Diagnosis not present

## 2021-09-10 DIAGNOSIS — G8929 Other chronic pain: Secondary | ICD-10-CM | POA: Diagnosis not present

## 2021-09-10 DIAGNOSIS — I10 Essential (primary) hypertension: Secondary | ICD-10-CM

## 2021-09-10 MED ORDER — TRAMADOL HCL 50 MG PO TABS: 50.0000 mg | ORAL_TABLET | Freq: Three times a day (TID) | ORAL | 0 refills | Status: DC | PRN

## 2021-09-10 MED ORDER — CARVEDILOL 6.25 MG PO TABS: 6.2500 mg | ORAL_TABLET | Freq: Two times a day (BID) | ORAL | 3 refills | Status: DC

## 2021-09-10 NOTE — Progress Notes (Signed)
? ? ?  Procedures performed today:   ? ?None. ? ?Independent interpretation of notes and tests performed by another provider:  ? ?None. ? ?Brief History, Exam, Impression, and Recommendations:   ? ?Right knee pain ?This is a very pleasant 51 year old male, he works for [employer], spends a lot of time on his feet, he had some bilateral knee pain, meloxicam initially was helping, at the last visit he had worsening pain with swelling in the right knee. ?I did an aspiration and injection, pain improved 70%. ?Unfortunately he still has some discomfort, particularly when working, he also has pain with terminal flexion, medial joint line discomfort, mild effusion. ?Ligaments are stable, with the pain with terminal flexion I do think he has a meniscal injury, proceeding with MRI and tramadol. ? ?Essential hypertension ?Currently on amlodipine 10, lisinopril 20, Coreg 3.125 twice daily, blood pressure still elevated, pulse rate elevated so we will bump up his Coreg to 6.25 twice daily.  He can double what he has on until he runs out and then use the new dose. ?2-week nurse visit blood pressure check. ? ? ? ?___________________________________________ ?Gwen Her. Dianah Field, M.D., ABFM., CAQSM. ?Primary Care and Sports Medicine ?[place] ? ?Adjunct Instructor of Family Medicine  ?[employer] ?

## 2021-09-10 NOTE — Assessment & Plan Note (Signed)
This is a very pleasant 51 year old male, he works for [employer], spends a lot of time on his feet, he had some bilateral knee pain, meloxicam initially was helping, at the last visit he had worsening pain with swelling in the right knee. ?I did an aspiration and injection, pain improved 70%. ?Unfortunately he still has some discomfort, particularly when working, he also has pain with terminal flexion, medial joint line discomfort, mild effusion. ?Ligaments are stable, with the pain with terminal flexion I do think he has a meniscal injury, proceeding with MRI and tramadol. ?

## 2021-09-10 NOTE — Assessment & Plan Note (Signed)
Currently on amlodipine 10, lisinopril 20, Coreg 3.125 twice daily, blood pressure still elevated, pulse rate elevated so we will bump up his Coreg to 6.25 twice daily.  He can double what he has on until he runs out and then use the new dose. ?2-week nurse visit blood pressure check. ?

## 2021-09-20 ENCOUNTER — Ambulatory Visit: Payer: Managed Care, Other (non HMO)

## 2021-09-20 ENCOUNTER — Other Ambulatory Visit: Payer: Self-pay

## 2021-09-21 ENCOUNTER — Encounter: Payer: Self-pay | Admitting: Sports Medicine

## 2021-09-21 DIAGNOSIS — G8929 Other chronic pain: Secondary | ICD-10-CM

## 2021-09-21 DIAGNOSIS — M25561 Pain in right knee: Secondary | ICD-10-CM

## 2021-09-21 MED ORDER — TRIAZOLAM 0.25 MG PO TABS: ORAL_TABLET | ORAL | 0 refills | Status: DC

## 2021-09-25 ENCOUNTER — Ambulatory Visit: Payer: Managed Care, Other (non HMO)

## 2021-10-02 ENCOUNTER — Ambulatory Visit: Payer: Managed Care, Other (non HMO)

## 2021-10-03 ENCOUNTER — Other Ambulatory Visit: Payer: Self-pay

## 2021-10-03 ENCOUNTER — Ambulatory Visit (INDEPENDENT_AMBULATORY_CARE_PROVIDER_SITE_OTHER): Payer: Managed Care, Other (non HMO)

## 2021-10-03 DIAGNOSIS — G8929 Other chronic pain: Secondary | ICD-10-CM | POA: Diagnosis not present

## 2021-10-03 DIAGNOSIS — M25561 Pain in right knee: Secondary | ICD-10-CM | POA: Diagnosis not present

## 2021-10-06 ENCOUNTER — Telehealth: Payer: Self-pay | Admitting: Sports Medicine

## 2021-10-06 DIAGNOSIS — M170 Bilateral primary osteoarthritis of knee: Secondary | ICD-10-CM

## 2021-10-06 NOTE — Telephone Encounter (Signed)
Orthovisc or any other viscosupplementation approval, right knee, x-ray and MRI confirmed osteoarthritis, failed injections, NSAIDs and greater than 6 weeks of physician directed conservative treatment. ?

## 2021-10-07 NOTE — Telephone Encounter (Signed)
[employer] paperwork faxed to [employer] at [phone] ?Request is for Orthovisc or Monovisc ?Pt's insurance prefers Orthovisc ?Fax confirmation receipt received  ?

## 2021-10-09 ENCOUNTER — Encounter: Payer: Self-pay | Admitting: Sports Medicine

## 2021-10-09 DIAGNOSIS — M170 Bilateral primary osteoarthritis of knee: Secondary | ICD-10-CM | POA: Insufficient documentation

## 2021-10-09 NOTE — Telephone Encounter (Signed)
Benefits Investigation Details received from [employer] ?Injection: Euflexxa ? ?Medical: Deductible applies. Once the deductible has been met, patient is responsible for a coinsurance. Once the OOP has been met, patient is covered at 100%. Prior Authorization is required for the drug through [employer]. To initiate, fax the provided form to [phone].  ?PA required: Yes ? ?PA form to [employer] at [phone]  ?Fax confirmation received ? ?Pharmacy: The product is not covered under the pharmacy plan.  ? ?Specialty Pharmacy: [facility] ? ?May fill through: [facility] and [facility] Specialty Pharmacy ?OV Copay/Coinsurance: 20% ?Product Copay: 20% ?Administration Coinsurance: 20% ?Administration Copay: 0 ?Deductible: $1300 (met: $7.86) =1292.14 ?Out of Pocket Max: $3200 (met: $214.27)  =2985.73 ? ? ?

## 2021-10-21 NOTE — Telephone Encounter (Signed)
Call to [facility] to check the status of the PA. It is still under review.  ?

## 2021-10-28 ENCOUNTER — Encounter (HOSPITAL_BASED_OUTPATIENT_CLINIC_OR_DEPARTMENT_OTHER): Payer: Managed Care, Other (non HMO) | Admitting: Internal Medicine

## 2021-10-29 NOTE — Telephone Encounter (Signed)
Patient aware that the PA has been approved and that Euflexxa was sent to the specialty pharmacy. The pharmacy would be in contact with him for payment and shipping confirmation. Advised him he would also have an approximately $50 copay for each visit as well. He verbalized understanding.  ?

## 2021-11-13 ENCOUNTER — Ambulatory Visit (HOSPITAL_BASED_OUTPATIENT_CLINIC_OR_DEPARTMENT_OTHER): Payer: Managed Care, Other (non HMO) | Admitting: Internal Medicine

## 2021-11-26 NOTE — Telephone Encounter (Signed)
It looks like the euflexxa never got sent to the pharmacy. Please complete the prescription order and send in.

## 2021-11-26 NOTE — Addendum Note (Signed)
Addended by: Dema Severin on: 11/26/2021 02:58 PM   Modules accepted: Orders

## 2021-11-27 MED ORDER — EUFLEXXA 20 MG/2ML IX SOSY: PREFILLED_SYRINGE | INTRA_ARTICULAR | 1 refills | Status: DC

## 2021-11-27 NOTE — Telephone Encounter (Signed)
Sent!

## 2021-11-27 NOTE — Addendum Note (Signed)
Addended by: Silverio Decamp on: 11/27/2021 10:58 AM   Modules accepted: Orders

## 2021-11-27 NOTE — Telephone Encounter (Signed)
Daniel,d called from [place], pt pa has expired 11/25/21, please resubmitt a new pa for this pt, [employer]'s  preferred speciality pharmacy is [facility]

## 2021-12-03 NOTE — Telephone Encounter (Signed)
Re-submitted PA through Covermymeds for Euflexxa since it has expired. Patient aware.

## 2021-12-10 ENCOUNTER — Other Ambulatory Visit: Payer: Self-pay

## 2021-12-10 MED ORDER — EUFLEXXA 20 MG/2ML IX SOSY: PREFILLED_SYRINGE | INTRA_ARTICULAR | 1 refills | Status: DC

## 2021-12-10 NOTE — Telephone Encounter (Signed)
Spoke with Christella Scheuermann to renew authorization since we never rec'd Euflexxa from the pharmacy. New Auth good from 12/10/21 thru 03/12/22. Prescription sent to specialty pharmacy.   Left VM for patient with the above information.

## 2021-12-24 NOTE — Telephone Encounter (Signed)
Spoke with [facility] who stated that they were just waiting on patient to call to release the shipment.   Called patient and gave him the phone number to contact [facility].

## 2022-01-08 NOTE — Telephone Encounter (Signed)
Euflexxa rec'd in office. Please call patient to schedule 3 once weekly injections.

## 2022-01-08 NOTE — Telephone Encounter (Signed)
Patient has been scheduled for Euflexxa injections. AMUCK

## 2022-01-18 ENCOUNTER — Ambulatory Visit: Payer: Managed Care, Other (non HMO) | Admitting: Sports Medicine

## 2022-01-18 ENCOUNTER — Ambulatory Visit (HOSPITAL_BASED_OUTPATIENT_CLINIC_OR_DEPARTMENT_OTHER): Payer: Managed Care, Other (non HMO) | Attending: Medical-Surgical | Admitting: Internal Medicine

## 2022-01-25 ENCOUNTER — Ambulatory Visit (INDEPENDENT_AMBULATORY_CARE_PROVIDER_SITE_OTHER): Payer: Managed Care, Other (non HMO)

## 2022-01-25 ENCOUNTER — Ambulatory Visit (INDEPENDENT_AMBULATORY_CARE_PROVIDER_SITE_OTHER): Payer: Managed Care, Other (non HMO) | Admitting: Sports Medicine

## 2022-01-25 DIAGNOSIS — M170 Bilateral primary osteoarthritis of knee: Secondary | ICD-10-CM | POA: Diagnosis not present

## 2022-01-25 NOTE — Assessment & Plan Note (Signed)
Euflexxa No. 1 of 3 right knee, return in 1 week for #2 of 3.

## 2022-01-25 NOTE — Progress Notes (Signed)
    Procedures performed today:    Procedure: Real-time Ultrasound Guided injection of the right knee Device: Samsung HS60  Verbal informed consent obtained.  Time-out conducted.  Noted no overlying erythema, induration, or other signs of local infection.  Skin prepped in a sterile fashion.  Local anesthesia: Topical Ethyl chloride.  With sterile technique and under real time ultrasound guidance: moderate effusion noted, 1 syringe of Euflexxa injected into the suprapatellar recess through 22-gauge needle. Completed without difficulty  Advised to call if fevers/chills, erythema, induration, drainage, or persistent bleeding.  Images permanently stored and available for review in PACS.  Impression: Technically successful ultrasound guided injection.  Independent interpretation of notes and tests performed by another provider:   None.  Brief History, Exam, Impression, and Recommendations:    Primary osteoarthritis of both knees Euflexxa No. 1 of 3 right knee, return in 1 week for #2 of 3.    ____________________________________________ Gwen Her. Dianah Field, M.D., ABFM., CAQSM., AME. Primary Care and Sports Medicine [facility]  Adjunct Professor of [place] of [facility] of Medicine  Risk manager

## 2022-02-01 ENCOUNTER — Ambulatory Visit (INDEPENDENT_AMBULATORY_CARE_PROVIDER_SITE_OTHER): Payer: Managed Care, Other (non HMO) | Admitting: Sports Medicine

## 2022-02-01 ENCOUNTER — Ambulatory Visit (INDEPENDENT_AMBULATORY_CARE_PROVIDER_SITE_OTHER): Payer: Managed Care, Other (non HMO)

## 2022-02-01 DIAGNOSIS — M170 Bilateral primary osteoarthritis of knee: Secondary | ICD-10-CM

## 2022-02-01 NOTE — Assessment & Plan Note (Signed)
Euflexxa injection #2 of 3 right knee, return in 1 week for #3 of 3

## 2022-02-01 NOTE — Progress Notes (Signed)
    Procedures performed today:    Procedure: Real-time Ultrasound Guided injection of the right knee Device: Samsung HS60  Verbal informed consent obtained.  Time-out conducted.  Noted no overlying erythema, induration, or other signs of local infection.  Skin prepped in a sterile fashion.  Local anesthesia: Topical Ethyl chloride.  With sterile technique and under real time ultrasound guidance: moderate effusion noted, 1 syringe of Euflexxa injected into the suprapatellar recess through 22-gauge needle. Completed without difficulty  Advised to call if fevers/chills, erythema, induration, drainage, or persistent bleeding.  Images permanently stored and available for review in PACS.  Impression: Technically successful ultrasound guided injection.  Independent interpretation of notes and tests performed by another provider:   None.  Brief History, Exam, Impression, and Recommendations:    Primary osteoarthritis of both knees Euflexxa injection #2 of 3 right knee, return in 1 week for #3 of 3    ____________________________________________ Gwen Her. Dianah Field, M.D., ABFM., CAQSM., AME. Primary Care and Sports Medicine [place] [facility]  Adjunct Professor of [place] [employer] [facility] of Medicine  Risk manager

## 2022-02-08 ENCOUNTER — Ambulatory Visit (INDEPENDENT_AMBULATORY_CARE_PROVIDER_SITE_OTHER): Payer: Managed Care, Other (non HMO)

## 2022-02-08 ENCOUNTER — Ambulatory Visit (INDEPENDENT_AMBULATORY_CARE_PROVIDER_SITE_OTHER): Payer: Managed Care, Other (non HMO) | Admitting: Sports Medicine

## 2022-02-08 DIAGNOSIS — M170 Bilateral primary osteoarthritis of knee: Secondary | ICD-10-CM | POA: Diagnosis not present

## 2022-02-08 NOTE — Progress Notes (Signed)
    Procedures performed today:    Procedure: Real-time Ultrasound Guided aspiration/injection of the right knee Device: Samsung HS60  Verbal informed consent obtained.  Time-out conducted.  Noted no overlying erythema, induration, or other signs of local infection.  Skin prepped in a sterile fashion.  Local anesthesia: Topical Ethyl chloride.  With sterile technique and under real time ultrasound guidance: moderate effusion noted, aspirated 16 mL of clear, straw-colored fluid, syringe switched and 1 syringe of Euflexxa injected into the suprapatellar recess through 18-gauge needle. Completed without difficulty  Advised to call if fevers/chills, erythema, induration, drainage, or persistent bleeding.  Images permanently stored and available for review in PACS.  Impression: Technically successful ultrasound guided aspiration/injection.  Independent interpretation of notes and tests performed by another provider:   None.  Brief History, Exam, Impression, and Recommendations:    Primary osteoarthritis of both knees Euflexxa 3 of 3 right knee, return as needed. Not feeling any better yet.    ____________________________________________ Gwen Her. Dianah Field, M.D., ABFM., CAQSM., AME. Primary Care and Sports Medicine [place] [facility]  Adjunct Professor of [place] of [facility] of Medicine  Risk manager

## 2022-02-08 NOTE — Assessment & Plan Note (Addendum)
Euflexxa 3 of 3 right knee, return as needed. Not feeling any better yet.

## 2022-03-08 ENCOUNTER — Ambulatory Visit (INDEPENDENT_AMBULATORY_CARE_PROVIDER_SITE_OTHER): Payer: Managed Care, Other (non HMO) | Admitting: Sports Medicine

## 2022-03-08 DIAGNOSIS — G8929 Other chronic pain: Secondary | ICD-10-CM

## 2022-03-08 DIAGNOSIS — M25561 Pain in right knee: Secondary | ICD-10-CM | POA: Diagnosis not present

## 2022-03-08 DIAGNOSIS — M170 Bilateral primary osteoarthritis of knee: Secondary | ICD-10-CM

## 2022-03-08 MED ORDER — TRAMADOL HCL 50 MG PO TABS
50.0000 mg | ORAL_TABLET | Freq: Every day | ORAL | 0 refills | Status: DC
Start: 2022-03-08 — End: 2022-04-05

## 2022-03-08 NOTE — Assessment & Plan Note (Signed)
Pleasant 51 year old male has finished a series of Euflexxa, about a month ago, right knee. Still has some pain at night, he does take meloxicam with dinner, adding tramadol nightly with dinner, return to see me in a month, if this works we can continue long-term, if not he will need to consider arthroplasty.

## 2022-03-08 NOTE — Progress Notes (Signed)
    Procedures performed today:    None.  Independent interpretation of notes and tests performed by another provider:   None.  Brief History, Exam, Impression, and Recommendations:    Primary osteoarthritis of both knees Pleasant 51 year old male has finished a series of Euflexxa, about a month ago, right knee. Still has some pain at night, he does take meloxicam with dinner, adding tramadol nightly with dinner, return to see me in a month, if this works we can continue long-term, if not he will need to consider arthroplasty.    ____________________________________________ Gwen Her. Dianah Field, M.D., ABFM., CAQSM., AME. Primary Care and Sports Medicine [place] [facility]  Adjunct Professor of [place] [employer] [facility] of Medicine  Risk manager

## 2022-03-29 ENCOUNTER — Encounter: Payer: Managed Care, Other (non HMO) | Admitting: Medical-Surgical

## 2022-04-04 NOTE — Progress Notes (Signed)
Complete physical exam  Patient: Scott Joseph   DOB: 02/20/1971   51 y.o. Male  MRN: [id number]  Subjective:    Chief Complaint  Patient presents with   Annual Exam    Zacharey Jensen is a 51 y.o. male who presents today for a complete physical exam. He reports consuming a general diet.  Does stretching every day at work.   He generally feels fairly well. He reports sleeping ok but has a sleep study coming up and has a couple of episodes of nocturia. He does not have additional problems to discuss today.    Most recent fall risk assessment:    09/01/2021    8:32 AM  [place] in the past year? 0  Number falls in past yr: 0  Injury with Fall? 0  Risk for fall due to : No Fall Risks  Follow up Falls evaluation completed     Most recent depression screenings:    04/05/2022    2:16 PM 09/01/2021    8:31 AM  PHQ 2/9 Scores  PHQ - 2 Score 0 0    Vision:Within last year, Dental: No current dental problems and Receives regular dental care, and STD: The patient denies history of sexually transmitted disease.    Patient Care Team: Samuel Bouche, NP as PCP - General (Nurse Practitioner)   Outpatient Medications Prior to Visit  Medication Sig   allopurinol (ZYLOPRIM) 300 MG tablet Take 1 tablet (300 mg total) by mouth daily.   amLODipine (NORVASC) 10 MG tablet Take 1 tablet (10 mg total) by mouth daily.   atorvastatin (LIPITOR) 40 MG tablet Take 1 tablet (40 mg total) by mouth daily.   carvedilol (COREG) 6.25 MG tablet Take 1 tablet (6.25 mg total) by mouth 2 (two) times daily.   cetirizine (ZYRTEC) 10 MG tablet Take 1 tablet (10 mg total) by mouth daily.   escitalopram (LEXAPRO) 10 MG tablet Take 1 tablet (10 mg total) by mouth daily.   fluticasone (FLONASE) 50 MCG/ACT nasal spray Place 2 sprays into both nostrils daily.   lisinopril (ZESTRIL) 20 MG tablet Take 1 tablet (20 mg total) by mouth daily.   meloxicam (MOBIC) 15 MG tablet One tab PO qAM with a meal for 2 weeks,  then daily prn pain.   Multiple Vitamin (MULTIVITAMIN PO) Take by mouth daily.   Sodium Hyaluronate (EUFLEXXA) 20 MG/2ML SOSY Inject 1 syringe into the right knee weekly x3   traMADol (ULTRAM) 50 MG tablet Take 1 tablet (50 mg total) by mouth 3 (three) times daily as needed.   triazolam (HALCION) 0.25 MG tablet 1-2 tabs PO 2 hours before procedure or imaging.  Do not drive with this medication.   No facility-administered medications prior to visit.    Review of Systems  Constitutional:  Negative for chills, fever, malaise/fatigue and weight loss.  HENT:  Negative for congestion, ear pain, hearing loss, sinus pain and sore throat.   Eyes:  Negative for blurred vision, photophobia, pain and discharge.  Respiratory:  Negative for cough, shortness of breath and wheezing.   Cardiovascular:  Negative for chest pain, palpitations and leg swelling.  Gastrointestinal:  Negative for abdominal pain, blood in stool, constipation, diarrhea, heartburn, melena, nausea and vomiting.  Genitourinary:  Negative for dysuria, frequency and urgency.  Musculoskeletal:  Positive for joint pain (right knee, left knee, right shoulder). Negative for falls and neck pain.  Skin:  Negative for itching and rash.  Neurological:  Positive for tingling (intermittent  to upper extremities and when driving with left hand). Negative for dizziness, weakness and headaches.  Endo/Heme/Allergies:  Negative for polydipsia. Does not bruise/bleed easily.  Psychiatric/Behavioral:  Negative for depression, substance abuse and suicidal ideas. The patient is not nervous/anxious and does not have insomnia.      Objective:    BP (!) 142/89 (BP Location: Right Arm, Cuff Size: Normal)   Pulse (!) 58   Resp 20   Ht 5' 2.25" (1.581 m)   Wt 158 lb 1.9 oz (71.7 kg)   SpO2 99%   BMI 28.69 kg/m    Physical Exam Constitutional:      General: He is not in acute distress.    Appearance: Normal appearance. He is not ill-appearing.  HENT:      Head: Normocephalic and atraumatic.     Right Ear: Tympanic membrane, ear canal and external ear normal. There is no impacted cerumen.     Left Ear: Tympanic membrane, ear canal and external ear normal. There is no impacted cerumen.     Nose: Nose normal.     Mouth/Throat:     Mouth: Mucous membranes are moist.     Pharynx: No oropharyngeal exudate or posterior oropharyngeal erythema.  Eyes:     General: No scleral icterus.       Right eye: No discharge.        Left eye: No discharge.     Extraocular Movements: Extraocular movements intact.     Conjunctiva/sclera: Conjunctivae normal.     Pupils: Pupils are equal, round, and reactive to light.  Neck:     Thyroid: No thyromegaly.     Vascular: No carotid bruit or JVD.     Trachea: Trachea normal.  Cardiovascular:     Rate and Rhythm: Normal rate and regular rhythm.     Pulses: Normal pulses.     Heart sounds: Normal heart sounds. No murmur heard.    No friction rub. No gallop.  Pulmonary:     Effort: Pulmonary effort is normal. No respiratory distress.     Breath sounds: Normal breath sounds. No wheezing.  Abdominal:     General: Bowel sounds are normal. There is no distension.     Palpations: Abdomen is soft. There is no mass.     Tenderness: There is no abdominal tenderness. There is no guarding or rebound.     Hernia: No hernia is present.  Musculoskeletal:        General: Normal range of motion.     Cervical back: Normal range of motion and neck supple.  Lymphadenopathy:     Cervical: No cervical adenopathy.  Skin:    General: Skin is warm and dry.  Neurological:     Mental Status: He is alert and oriented to person, place, and time.     Cranial Nerves: No cranial nerve deficit.  Psychiatric:        Mood and Affect: Mood normal.        Behavior: Behavior normal.        Thought Content: Thought content normal.        Judgment: Judgment normal.   No results found for any visits on 04/05/22.     Assessment &  Plan:    Routine Health Maintenance and Physical Exam  Immunization History  Administered Date(s) Administered   Influenza,inj,Quad PF,6+ Mos 08/26/2020, 04/05/2022   Influenza-Unspecified 03/27/2019, 03/17/2021   Moderna Sars-Covid-2 Vaccination 12/04/2019, 01/01/2020   Tdap 07/29/2014   Zoster Recombinat (Shingrix) 09/01/2021, 04/05/2022  Health Maintenance  Topic Date Due   COVID-19 Vaccine (3 - Moderna series) 04/18/2022 (Originally 02/26/2020)   TETANUS/TDAP  07/29/2024   COLONOSCOPY (Pts 45-26yr Insurance coverage will need to be confirmed)  12/25/2026   INFLUENZA VACCINE  Completed   Hepatitis C Screening  Completed   HIV Screening  Completed   Zoster Vaccines- Shingrix  Completed   HPV VACCINES  Aged Out    Discussed health benefits of physical activity, and encouraged him to engage in regular exercise appropriate for his age and condition.  1. Need for influenza vaccination Flu vaccine given in office today. - Flu Vaccine QUAD 6+ mos PF IM (Fluarix Quad PF)  2. Need for shingles vaccine Shingrix No. 2 given in office today.  Series complete. - Zoster Recombinant (Shingrix )  3. Annual physical exam Checking labs as below.  Wellness information provided with AVS.  Up-to-date on preventative care. - Lipid panel - COMPLETE METABOLIC PANEL WITH GFR - CBC with Differential/Platelet  4. Essential hypertension Blood pressure elevated on arrival and again on recheck.  Discussed readings with patient.  Would like him to get a new blood pressure cuff and monitor at home over the next couple of weeks to see what his home readings look like.  For now continue amlodipine 10 mg and lisinopril 20 mg daily as prescribed.  Continue carvedilol 6.25 mg twice daily.  I will send a message to him on MyChart in a couple of weeks to see how his blood pressures have been running. - amLODipine (NORVASC) 10 MG tablet; Take 1 tablet (10 mg total) by mouth daily.  Dispense: 90 tablet;  Refill: 1 - carvedilol (COREG) 6.25 MG tablet; Take 1 tablet (6.25 mg total) by mouth 2 (two) times daily.  Dispense: 60 tablet; Refill: 3 - lisinopril (ZESTRIL) 20 MG tablet; Take 1 tablet (20 mg total) by mouth daily.  Dispense: 90 tablet; Refill: 1  5. Mixed hyperlipidemia Checking labs as above.  Continue Lipitor 40 mg daily.  6. Anxiety Symptoms well controlled.  Continue Lexapro 10 mg daily. - escitalopram (LEXAPRO) 10 MG tablet; Take 1 tablet (10 mg total) by mouth daily.  Dispense: 90 tablet; Refill: 1  7. Gout of foot, unspecified cause, unspecified chronicity, unspecified laterality Checking uric acid today. - Uric acid  8. Swelling of first metatarsophalangeal (MTP) joint of left foot Continue allopurinol as prescribed. - allopurinol (ZYLOPRIM) 300 MG tablet; Take 1 tablet (300 mg total) by mouth daily.  Dispense: 90 tablet; Refill: 1  9. Screening for endocrine disorder Checking hemoglobin A1c. - Hemoglobin A1c  No follow-ups on file.   JSamuel Bouche NP

## 2022-04-05 ENCOUNTER — Ambulatory Visit (INDEPENDENT_AMBULATORY_CARE_PROVIDER_SITE_OTHER): Payer: Managed Care, Other (non HMO) | Admitting: Sports Medicine

## 2022-04-05 ENCOUNTER — Ambulatory Visit (INDEPENDENT_AMBULATORY_CARE_PROVIDER_SITE_OTHER): Payer: Managed Care, Other (non HMO) | Admitting: Medical-Surgical

## 2022-04-05 ENCOUNTER — Encounter: Payer: Self-pay | Admitting: Medical-Surgical

## 2022-04-05 VITALS — BP 142/89 | HR 58 | Resp 20 | Ht 62.25 in | Wt 158.1 lb

## 2022-04-05 DIAGNOSIS — Z0000 Encounter for general adult medical examination without abnormal findings: Secondary | ICD-10-CM

## 2022-04-05 DIAGNOSIS — G8929 Other chronic pain: Secondary | ICD-10-CM | POA: Diagnosis not present

## 2022-04-05 DIAGNOSIS — I10 Essential (primary) hypertension: Secondary | ICD-10-CM

## 2022-04-05 DIAGNOSIS — Z125 Encounter for screening for malignant neoplasm of prostate: Secondary | ICD-10-CM

## 2022-04-05 DIAGNOSIS — M109 Gout, unspecified: Secondary | ICD-10-CM

## 2022-04-05 DIAGNOSIS — F419 Anxiety disorder, unspecified: Secondary | ICD-10-CM

## 2022-04-05 DIAGNOSIS — M25475 Effusion, left foot: Secondary | ICD-10-CM

## 2022-04-05 DIAGNOSIS — Z23 Encounter for immunization: Secondary | ICD-10-CM | POA: Diagnosis not present

## 2022-04-05 DIAGNOSIS — E782 Mixed hyperlipidemia: Secondary | ICD-10-CM | POA: Diagnosis not present

## 2022-04-05 DIAGNOSIS — M170 Bilateral primary osteoarthritis of knee: Secondary | ICD-10-CM

## 2022-04-05 DIAGNOSIS — Z1329 Encounter for screening for other suspected endocrine disorder: Secondary | ICD-10-CM

## 2022-04-05 DIAGNOSIS — M25561 Pain in right knee: Secondary | ICD-10-CM | POA: Diagnosis not present

## 2022-04-05 MED ORDER — TRAMADOL HCL 50 MG PO TABS: 50.0000 mg | ORAL_TABLET | Freq: Three times a day (TID) | ORAL | 3 refills | Status: DC | PRN

## 2022-04-05 MED ORDER — ATORVASTATIN CALCIUM 40 MG PO TABS: 40.0000 mg | ORAL_TABLET | Freq: Every day | ORAL | 1 refills | Status: DC

## 2022-04-05 MED ORDER — LISINOPRIL 20 MG PO TABS: 20.0000 mg | ORAL_TABLET | Freq: Every day | ORAL | 1 refills | Status: DC

## 2022-04-05 MED ORDER — ALLOPURINOL 300 MG PO TABS: 300.0000 mg | ORAL_TABLET | Freq: Every day | ORAL | 1 refills | Status: DC

## 2022-04-05 MED ORDER — ESCITALOPRAM OXALATE 10 MG PO TABS: 10.0000 mg | ORAL_TABLET | Freq: Every day | ORAL | 1 refills | Status: DC

## 2022-04-05 MED ORDER — CARVEDILOL 6.25 MG PO TABS: 6.2500 mg | ORAL_TABLET | Freq: Two times a day (BID) | ORAL | 3 refills | Status: DC

## 2022-04-05 MED ORDER — AMLODIPINE BESYLATE 10 MG PO TABS: 10.0000 mg | ORAL_TABLET | Freq: Every day | ORAL | 1 refills | Status: DC

## 2022-04-05 MED ORDER — FLUTICASONE PROPIONATE 50 MCG/ACT NA SUSP: 2.0000 | Freq: Every day | NASAL | 6 refills | Status: DC

## 2022-04-05 MED ORDER — CETIRIZINE HCL 10 MG PO TABS: 10.0000 mg | ORAL_TABLET | Freq: Every day | ORAL | 3 refills | Status: DC

## 2022-04-05 NOTE — Assessment & Plan Note (Signed)
This is a pleasant 51 year old male, he had has moderate to severe osteoarthritis confirmed on x-rays and MRI. We have done steroid injections, we finished a series of Euflexxa, he was still having pain so we continue meloxicam and added tramadol nightly. Unfortunately continuing to have life and activity limiting discomfort. Increasing tramadol to 3 times daily, I would like a consultation with Dr. Griffin Basil or Mardelle Matte for consideration of arthroplasty.

## 2022-04-05 NOTE — Progress Notes (Signed)
    Procedures performed today:    None.  Independent interpretation of notes and tests performed by another provider:   None.  Brief History, Exam, Impression, and Recommendations:    Primary osteoarthritis of both knees This is a pleasant 51 year old male, he had has moderate to severe osteoarthritis confirmed on x-rays and MRI. We have done steroid injections, we finished a series of Euflexxa, he was still having pain so we continue meloxicam and added tramadol nightly. Unfortunately continuing to have life and activity limiting discomfort. Increasing tramadol to 3 times daily, I would like a consultation with Dr. Griffin Basil or Mardelle Matte for consideration of arthroplasty.    ____________________________________________ Gwen Her. Dianah Field, M.D., ABFM., CAQSM., AME. Primary Care and Sports Medicine [facility]  Adjunct Professor of [employer] [facility] [employer]  Risk manager

## 2022-04-06 LAB — CBC WITH DIFFERENTIAL/PLATELET
Absolute Monocytes: 824 cells/uL (ref 200–950)
Basophils Absolute: 75 cells/uL (ref 0–200)
Basophils Relative: 0.7 %
Eosinophils Absolute: 225 cells/uL (ref 15–500)
Eosinophils Relative: 2.1 %
HCT: 43.2 % (ref 38.5–50.0)
Hemoglobin: 14.3 g/dL (ref 13.2–17.1)
Lymphs Abs: 2268 cells/uL (ref 850–3900)
MCH: 30.4 pg (ref 27.0–33.0)
MCHC: 33.1 g/dL (ref 32.0–36.0)
MCV: 91.9 fL (ref 80.0–100.0)
MPV: 11.4 fL (ref 7.5–12.5)
Monocytes Relative: 7.7 %
Neutro Abs: 7308 cells/uL (ref 1500–7800)
Neutrophils Relative %: 68.3 %
Platelets: 443 10*3/uL — ABNORMAL HIGH (ref 140–400)
RBC: 4.7 10*6/uL (ref 4.20–5.80)
RDW: 15.8 % — ABNORMAL HIGH (ref 11.0–15.0)
Total Lymphocyte: 21.2 %
WBC: 10.7 10*3/uL (ref 3.8–10.8)

## 2022-04-06 LAB — HEMOGLOBIN A1C
Hgb A1c MFr Bld: 5.7 % of total Hgb — ABNORMAL HIGH (ref <5.7)
Mean Plasma Glucose: 117 mg/dL
eAG (mmol/L): 6.5 mmol/L

## 2022-04-06 LAB — COMPLETE METABOLIC PANEL WITH GFR
AG Ratio: 1.7 (calc) (ref 1.0–2.5)
ALT: 13 U/L (ref 9–46)
AST: 13 U/L (ref 10–35)
Albumin: 4.6 g/dL (ref 3.6–5.1)
Alkaline phosphatase (APISO): 97 U/L (ref 35–144)
BUN/Creatinine Ratio: 13 (calc) (ref 6–22)
BUN: 22 mg/dL (ref 7–25)
CO2: 28 mmol/L (ref 20–32)
Calcium: 10.2 mg/dL (ref 8.6–10.3)
Chloride: 102 mmol/L (ref 98–110)
Creat: 1.74 mg/dL — ABNORMAL HIGH (ref 0.70–1.30)
Globulin: 2.7 g/dL (calc) (ref 1.9–3.7)
Glucose, Bld: 73 mg/dL (ref 65–99)
Potassium: 4.8 mmol/L (ref 3.5–5.3)
Sodium: 138 mmol/L (ref 135–146)
Total Bilirubin: 0.9 mg/dL (ref 0.2–1.2)
Total Protein: 7.3 g/dL (ref 6.1–8.1)
eGFR: 47 mL/min/{1.73_m2} — ABNORMAL LOW (ref >=60)

## 2022-04-06 LAB — PSA: PSA: 1.57 ng/mL (ref <=4.00)

## 2022-04-06 LAB — LIPID PANEL
Cholesterol: 194 mg/dL (ref <200)
HDL: 49 mg/dL (ref >=40)
LDL Cholesterol (Calc): 119 mg/dL (calc) — ABNORMAL HIGH
Non-HDL Cholesterol (Calc): 145 mg/dL (calc) — ABNORMAL HIGH (ref <130)
Total CHOL/HDL Ratio: 4 (calc) (ref <5.0)
Triglycerides: 143 mg/dL (ref <150)

## 2022-04-06 LAB — URIC ACID: Uric Acid, Serum: 3.9 mg/dL — ABNORMAL LOW (ref 4.0–8.0)

## 2022-04-19 ENCOUNTER — Encounter: Payer: Self-pay | Admitting: Medical-Surgical

## 2022-04-19 MED ORDER — LISINOPRIL 10 MG PO TABS: 10.0000 mg | ORAL_TABLET | Freq: Every day | ORAL | 3 refills | Status: DC

## 2022-05-03 ENCOUNTER — Ambulatory Visit (HOSPITAL_BASED_OUTPATIENT_CLINIC_OR_DEPARTMENT_OTHER): Payer: Managed Care, Other (non HMO) | Attending: Medical-Surgical | Admitting: Internal Medicine

## 2022-05-03 VITALS — Ht 62.0 in | Wt 164.0 lb

## 2022-05-03 DIAGNOSIS — R0683 Snoring: Secondary | ICD-10-CM | POA: Insufficient documentation

## 2022-05-03 DIAGNOSIS — G4733 Obstructive sleep apnea (adult) (pediatric): Secondary | ICD-10-CM | POA: Diagnosis not present

## 2022-05-08 DIAGNOSIS — R0683 Snoring: Secondary | ICD-10-CM

## 2022-05-08 NOTE — Procedures (Signed)
   Patient Name: Scott Joseph, Scott Joseph Date: 05/03/2022 Gender: Male D.O.B: March 17, 1971 Age (years): 63 Referring Provider: Samuel Bouche NP Height (inches): 33 Interpreting Physician: Baird Lyons MD, ABSM Weight (lbs): 164 RPSGT: Jacolyn Reedy BMI: 30 MRN: [id number] Neck Size: 16.00  CLINICAL INFORMATION Sleep Study Type: HST Indication for sleep study: Snoring Epworth Sleepiness Score: 6  SLEEP STUDY TECHNIQUE A multi-channel overnight portable sleep study was performed. The channels recorded were: nasal airflow, thoracic respiratory movement, and oxygen saturation with a pulse oximetry. Snoring was also monitored.  MEDICATIONS Patient self administered medications include: none reported.  SLEEP ARCHITECTURE Patient was studied for 364.5 minutes. The sleep efficiency was 100.0 % and the patient was supine for 0%. The arousal index was 0.0 per hour.  RESPIRATORY PARAMETERS The overall AHI was 55.6 per hour, with a central apnea index of 0 per hour. The oxygen nadir was 84% during sleep.  CARDIAC DATA Mean heart rate during sleep was 71.2 bpm.  IMPRESSIONS - Severe obstructive sleep apnea occurred during this study (AHI = 55.6/h). - Moderate oxygen desaturation was noted during this study (Min O2 = 84%). - Patient snored.  DIAGNOSIS - Obstructive Sleep Apnea (G47.33)  RECOMMENDATIONS - Suggest CPAP titration sleep study or autopap. Other options would be based on clinical judgment. - Be careful with alcohol, sedatives and other CNS depressants that may worsen sleep apnea and disrupt normal sleep architecture. - Sleep hygiene should be reviewed to assess factors that may improve sleep quality. - Weight management and regular exercise should be initiated or continued.  [Electronically signed] 05/08/2022 11:59 AM  Baird Lyons MD, ABSM Diplomate, American Board of Sleep Medicine NPI: [id number]                          [place], [place] [facility]  ELECTRONICALLY SIGNED ON:  05/08/2022, 11:56 AM [place] PH: [phone]   FX: [phone] [place]

## 2022-05-10 NOTE — Addendum Note (Signed)
Addended bySamuel Bouche on: 05/10/2022 09:03 AM   Modules accepted: Orders

## 2022-05-10 NOTE — Progress Notes (Signed)
CPAP titration study ordered.  Yes, we certainly can use [facility].  ___________________________________________ Clearnce Sorrel, DNP, APRN, FNP-BC Primary Care and [place]

## 2022-05-10 NOTE — Progress Notes (Signed)
Spoke with the patient. He prefers to do the CPAP titration study and he doesn't have a preferred medical supply company but wanted to know if we use [facility].

## 2022-05-17 ENCOUNTER — Ambulatory Visit (INDEPENDENT_AMBULATORY_CARE_PROVIDER_SITE_OTHER): Payer: Managed Care, Other (non HMO) | Admitting: Medical-Surgical

## 2022-05-17 ENCOUNTER — Encounter: Payer: Self-pay | Admitting: Medical-Surgical

## 2022-05-17 VITALS — BP 116/75 | HR 73 | Resp 20 | Ht 62.0 in | Wt 157.9 lb

## 2022-05-17 DIAGNOSIS — F419 Anxiety disorder, unspecified: Secondary | ICD-10-CM | POA: Diagnosis not present

## 2022-05-17 DIAGNOSIS — Z01818 Encounter for other preprocedural examination: Secondary | ICD-10-CM

## 2022-05-17 DIAGNOSIS — G4733 Obstructive sleep apnea (adult) (pediatric): Secondary | ICD-10-CM

## 2022-05-17 DIAGNOSIS — I10 Essential (primary) hypertension: Secondary | ICD-10-CM | POA: Diagnosis not present

## 2022-05-17 MED ORDER — ESCITALOPRAM OXALATE 10 MG PO TABS: 10.0000 mg | ORAL_TABLET | Freq: Every day | ORAL | 1 refills | Status: DC

## 2022-05-17 MED ORDER — AMLODIPINE BESYLATE 10 MG PO TABS: 10.0000 mg | ORAL_TABLET | Freq: Every day | ORAL | 1 refills | Status: DC

## 2022-05-17 NOTE — Progress Notes (Signed)
Established Patient Office Visit  Subjective   Patient ID: Scott Joseph, male   DOB: 11-17-1970 Age: 51 y.o. MRN: [id number]   Chief Complaint  Patient presents with   Form Completion   HPI Pleasant 51 year old male presenting today for discussion of preoperative clearance.  He is scheduled to have a right total knee replacement in January under spinal anesthesia.  Per the clearance form, he will have basic labs done at that time and any specialized testing should be done at our office.  He is up-to-date on preventative care and has had a recent annual physical exam.  Labs checked at that time showed no significant concerns.  Despite knee pain, he continues to exercise regularly with an effort for weight loss to healthy weight.  Hypertension: Taking amlodipine 10 mg and lisinopril 30 mg daily as prescribed, tolerating well without side effects.  Requesting a refill of amlodipine.  Mood: Has had some anxiety in the past that is currently managed with Lexapro 10 mg daily.  Feels the medication is working well and is requesting a refill.  Denies SI/HI.  Review of Systems  Constitutional:  Negative for chills, diaphoresis, fever and malaise/fatigue.  Respiratory:  Negative for cough, shortness of breath and wheezing.   Cardiovascular:  Negative for chest pain, palpitations and leg swelling.  Gastrointestinal:  Negative for abdominal pain, blood in stool, constipation, diarrhea, heartburn, nausea and vomiting.  Genitourinary:  Negative for dysuria, frequency and urgency.  Musculoskeletal:  Positive for joint pain (knee pain) and neck pain (occasional).  Neurological:  Negative for dizziness, tingling, seizures, weakness and headaches.  Psychiatric/Behavioral:  Negative for depression, substance abuse and suicidal ideas. The patient is not nervous/anxious and does not have insomnia.    Objective:    Vitals:   05/17/22 1104  BP: 116/75  Pulse: 73  Resp: 20  Height: '5\' 2"'$  (1.575 m)   Weight: 157 lb 14.4 oz (71.6 kg)  SpO2: 97%  BMI (Calculated): 28.87    Physical Exam Vitals reviewed.  Constitutional:      General: He is not in acute distress.    Appearance: Normal appearance. He is not ill-appearing.  HENT:     Head: Normocephalic and atraumatic.     Right Ear: Tympanic membrane, ear canal and external ear normal. There is no impacted cerumen.     Left Ear: Tympanic membrane, ear canal and external ear normal. There is no impacted cerumen.     Nose: Nose normal.     Mouth/Throat:     Mouth: Mucous membranes are moist.     Pharynx: No oropharyngeal exudate or posterior oropharyngeal erythema.  Cardiovascular:     Rate and Rhythm: Normal rate and regular rhythm.     Pulses: Normal pulses.     Heart sounds: Normal heart sounds. No murmur heard.    No friction rub. No gallop.  Pulmonary:     Effort: Pulmonary effort is normal. No respiratory distress.     Breath sounds: Normal breath sounds.  Skin:    General: Skin is warm and dry.  Neurological:     Mental Status: He is alert and oriented to person, place, and time.  Psychiatric:        Mood and Affect: Mood normal.        Behavior: Behavior normal.        Thought Content: Thought content normal.        Judgment: Judgment normal.    No results found for this or any previous  visit (from the past 24 hour(s)).     The 10-year ASCVD risk score (Arnett DK, et al., 2019) is: 8%   Values used to calculate the score:     Age: 43 years     Sex: Male     Is Non-Hispanic African American: Yes     Diabetic: No     Tobacco smoker: No     Systolic Blood Pressure: 131 mmHg     Is BP treated: Yes     HDL Cholesterol: 49 mg/dL     Total Cholesterol: 194 mg/dL   Assessment & Plan:   1. Preoperative clearance Reviewed history, problem list, medications, allergies, and vital signs. No further specialized testing required. Patient has greater than four metabolic equivalents of cardiac capacity and is considered  a low risk for a low risk procedure.   2. Severe obstructive sleep apnea CPAP titration study ordered.   3. Essential hypertension BP at goal. Refilling Amolodipine '10mg'$  daily.  - amLODipine (NORVASC) 10 MG tablet; Take 1 tablet (10 mg total) by mouth daily.  Dispense: 90 tablet; Refill: 1  4. Anxiety Stable. Continue Lexapro '10mg'$  daily.  - escitalopram (LEXAPRO) 10 MG tablet; Take 1 tablet (10 mg total) by mouth daily.  Dispense: 90 tablet; Refill: 1  Return if symptoms worsen or fail to improve. Chronic disease follow up in 6 months.  ___________________________________________ Clearnce Sorrel, DNP, APRN, FNP-BC Primary Care and [place]

## 2022-06-24 ENCOUNTER — Emergency Department (HOSPITAL_BASED_OUTPATIENT_CLINIC_OR_DEPARTMENT_OTHER)
Admission: EM | Admit: 2022-06-24 | Discharge: 2022-06-24 | Disposition: A | Discharge status: Home / Self Care | Payer: Managed Care, Other (non HMO) | Attending: Emergency Medicine | Admitting: Emergency Medicine

## 2022-06-24 ENCOUNTER — Other Ambulatory Visit: Payer: Self-pay

## 2022-06-24 ENCOUNTER — Emergency Department (HOSPITAL_BASED_OUTPATIENT_CLINIC_OR_DEPARTMENT_OTHER): Payer: Managed Care, Other (non HMO)

## 2022-06-24 DIAGNOSIS — M545 Low back pain, unspecified: Secondary | ICD-10-CM | POA: Insufficient documentation

## 2022-06-24 MED ORDER — ACETAMINOPHEN 325 MG PO TABS
650.0000 mg | ORAL_TABLET | Freq: Once | ORAL | Status: AC
  Administered 2022-06-24: 650 mg via ORAL
  Filled 2022-06-24: qty 2

## 2022-06-24 MED ORDER — METHOCARBAMOL 500 MG PO TABS: 1000.0000 mg | ORAL_TABLET | Freq: Every evening | ORAL | 0 refills | Status: AC

## 2022-06-24 MED ORDER — LIDOCAINE 4 % EX PTCH: 1.0000 | MEDICATED_PATCH | CUTANEOUS | 0 refills | Status: DC

## 2022-06-24 MED ORDER — LIDOCAINE 5 % EX PTCH
1.0000 | MEDICATED_PATCH | Freq: Once | CUTANEOUS | Status: DC
  Administered 2022-06-24: 1 via TRANSDERMAL
  Filled 2022-06-24: qty 1

## 2022-06-24 NOTE — ED Notes (Signed)
Dc instructions and scripts reviewed with pt no questions or concerns at this time. Will follow up with pcp Manson Passey

## 2022-06-24 NOTE — ED Provider Notes (Signed)
[place] EMERGENCY DEPARTMENT Provider Note   CSN: [id number] Arrival date & time: 06/24/22  4709    History  Chief Complaint  Patient presents with   Back Pain   Scott Joseph is a 51 y.o. male presenting to the ED today with acute lower back pain.  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  Was bent over for long periods in the back of the truck.  Then went to walk backwards and lost his footing.  Landed flat on his back.  Now with right lower back pain.  Denies numbness, tingling, weakness of legs.  Denies urinary or bowel incontinence.  Denies saddle anesthesia.  No previous back surgeries.  No other complaints at this time.  Did not hit head or LOC.  Denies feeling lightheaded, dizzy, short of breath, or with chest pain preceding or following the fall.  Not on anticoagulation.  The history is provided by the patient and medical records.  Back Pain     Home Medications Prior to Admission medications   Medication Sig Start Date End Date Taking? Authorizing Provider  allopurinol (ZYLOPRIM) 300 MG tablet Take 1 tablet (300 mg total) by mouth daily. 04/05/22   Samuel Bouche, NP  amLODipine (NORVASC) 10 MG tablet Take 1 tablet (10 mg total) by mouth daily. 05/17/22   Samuel Bouche, NP  atorvastatin (LIPITOR) 40 MG tablet Take 1 tablet (40 mg total) by mouth daily. 04/05/22   Samuel Bouche, NP  carvedilol (COREG) 6.25 MG tablet Take 1 tablet (6.25 mg total) by mouth 2 (two) times daily. 04/05/22   Samuel Bouche, NP  cetirizine (ZYRTEC) 10 MG tablet Take 1 tablet (10 mg total) by mouth daily. 04/05/22   Samuel Bouche, NP  escitalopram (LEXAPRO) 10 MG tablet Take 1 tablet (10 mg total) by mouth daily. 05/17/22   Samuel Bouche, NP  fluticasone (FLONASE) 50 MCG/ACT nasal spray Place 2 sprays into both nostrils daily. 04/05/22   Samuel Bouche, NP  lisinopril (ZESTRIL) 10 MG tablet Take 1 tablet (10 mg total) by mouth daily. 04/19/22   Samuel Bouche, NP  lisinopril (ZESTRIL) 20 MG  tablet Take 1 tablet (20 mg total) by mouth daily. 04/05/22   Samuel Bouche, NP  meloxicam (MOBIC) 15 MG tablet One tab PO qAM with a meal for 2 weeks, then daily prn pain. 03/03/21   Samuel Bouche, NP  Multiple Vitamin (MULTIVITAMIN PO) Take by mouth daily.    [provider]  Sodium Hyaluronate (EUFLEXXA) 20 MG/2ML SOSY Inject 1 syringe into the right knee weekly x3 12/10/21   Silverio Decamp, MD  traMADol (ULTRAM) 50 MG tablet Take 1 tablet (50 mg total) by mouth 3 (three) times daily as needed. 04/05/22   Silverio Decamp, MD  triazolam (HALCION) 0.25 MG tablet 1-2 tabs PO 2 hours before procedure or imaging.  Do not drive with this medication. 09/21/21   Silverio Decamp, MD      Allergies    Patient has no known allergies.    Review of Systems   Review of Systems  Musculoskeletal:  Positive for back pain.    Physical Exam Updated Vital Signs BP 138/88   Pulse 78   Temp 97.8 F (36.6 C) (Oral)   Resp 16   Ht '5\' 2"'$  (1.575 m)   Wt 70.3 kg   SpO2 99%   BMI 28.35 kg/m  Physical Exam Vitals and nursing note reviewed.  Constitutional:      General: He is  not in acute distress.    Appearance: He is well-developed. He is not ill-appearing.  HENT:     Head: Normocephalic and atraumatic.  Eyes:     Conjunctiva/sclera: Conjunctivae normal.  Cardiovascular:     Rate and Rhythm: Normal rate and regular rhythm.     Heart sounds: No murmur heard. Pulmonary:     Effort: Pulmonary effort is normal. No respiratory distress.     Breath sounds: Normal breath sounds.  Abdominal:     Palpations: Abdomen is soft.     Tenderness: There is no abdominal tenderness.  Musculoskeletal:        General: No swelling.     Cervical back: Neck supple. No rigidity.     Comments: Right lower back and possible midline tenderness near L4-L5.  Without crepitus, step-off, swelling, rash, or significant deformity.  Negative SLRs bilaterally.  ROM of back and hips appears grossly intact   Skin:    General: Skin is warm and dry.     Capillary Refill: Capillary refill takes less than 2 seconds.  Neurological:     Mental Status: He is alert.     GCS: GCS eye subscore is 4. GCS verbal subscore is 5. GCS motor subscore is 6.     Comments: No saddle anesthesia.  Patellar reflexes 2+ bilaterally.  Lower extremities appear grossly intact to sensation, coordination, and strength.  Gait intact.  Psychiatric:        Mood and Affect: Mood normal.     ED Results / Procedures / Treatments   Labs (all labs ordered are listed, but only abnormal results are displayed) Labs Reviewed - No data to display  EKG None  Radiology No results found.  Procedures Procedures    Medications Ordered in ED Medications - No data to display  ED Course/ Medical Decision Making/ A&P                           Medical Decision Making Amount and/or Complexity of Data Reviewed Radiology: ordered.  Risk OTC drugs. Prescription drug management.   51 y.o. male presents to the ED for concern of Back Pain   This involves an extensive number of treatment options, and is a complaint that carries with it a high risk of complications and morbidity.  The emergent differential diagnosis prior to evaluation includes, but is not limited to: Muscle spasm, fracture, contusion  This is not an exhaustive differential.   Past Medical History / Co-morbidities / Social History: Hx of essential HTN, anxiety, rheumatoid arthritis, hyperlipidemia, anxiety Social Determinants of Health include: None  Additional History:  None  Lab Tests: None  Imaging Studies: I ordered imaging studies including XR lumbar.   I independently visualized and interpreted imaging which showed no acute bony pathology I agree with the radiologist interpretation.  ED Course: Pt well-appearing on exam.  Patient with acute lower back pain.  Fell onto back and was bent over for long periods at work yesterday.  Fall appears  mechanical in nature.  No neurological deficits and normal neuro exam as described above.  Without midline spinal tenderness.  X-ray imaging negative for acute pathology.  Low concern for fracture or dislocation.  Patient can walk with overall gait intact.  No loss of bowel or bladder control, or saddle anesthesia.  Low concern for cauda equina, discitis, or infectious spinal process.  No fever, night sweats, weight loss, h/o cancer, IVDU.  SLRs negative bilaterally.  Lower extremities  appear neurovascularly intact.  ROM minimally limited due to elicited pain.  Pain managed in ED.  Heat therapy and conservative symptom mangagement discussed with patient.  Recommend close follow up with PCP/Orthopedics.  Patient in NAD and in good condition at time of discharge.  Disposition: After consideration the patient's encounter today, I do not feel today's workup suggests an emergent condition requiring admission or immediate intervention beyond what has been performed at this time.  Safe for discharge; instructed to return immediately for worsening symptoms, change in symptoms or any other concerns.  I have reviewed the patients home medicines and have made adjustments as needed.  Discussed course of treatment with the patient, whom demonstrated understanding.  Patient in agreement and has no further questions.    This chart was dictated using voice recognition software.  Despite best efforts to proofread, errors can occur which can change the documentation meaning.         Final Clinical Impression(s) / ED Diagnoses Final diagnoses:  Acute right-sided low back pain without sciatica    Rx / DC Orders ED Discharge Orders          Ordered    methocarbamol (ROBAXIN) 500 MG tablet  Nightly        06/24/22 1140    lidocaine 4 %  Every 24 hours        06/24/22 [place], Thelda Gagan M, [place] 70/48/88 0915    Leanord Asal K, DO 06/29/22 1454

## 2022-06-24 NOTE — ED Triage Notes (Signed)
Patient presents to ED via POV from home. 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 Denies numbness or urinary incontinence. Ambulatory with steady gait.

## 2022-06-24 NOTE — Discharge Instructions (Addendum)
You were evaluated in the emergency department today for lower back pain.  Your back pain should be treated with medicines such as ibuprofen or aleve and this back pain should get better over the next 2 weeks.  Low back pain is discomfort in the lower back that may be due to injuries to muscles and ligaments around the spine.  Occasionally, it may be caused by a a problem to a part of the spine called a disc.  The pain may last several days or a week;  However, most patients get completely well in 4 weeks.  Self - care:  The application of heat can help soothe the pain.  Maintaining your daily activities, including walking, is encourged, as it will help you get better faster than just staying in bed.  Medications are also useful to help with pain control.  A commonly prescribed medications includes acetaminophen.  This medication is generally safe, though you should not take more than 8 of the extra strength ('500mg'$ ) pills a day.  Non steroidal anti inflammatory medications including Ibuprofen and naproxen;  These medications help both pain and swelling and are very useful in treating back pain.  They should be taken with food, as they can cause stomach upset, and more seriously, stomach bleeding.    Muscle relaxants:  These medications can help with muscle tightness that is a cause of lower back pain.  Most of these medications can cause drowsiness, and it is not safe to drive or use dangerous machinery while taking them.  Please follow up with your primary healthcare provider in 2-4 days for reassessment.   You may also consider following up with a specialist as needed, which may or may not include physical therapy, orthopedic surgery, or neurosurgery.  Be aware that if you develop new symptoms, such as a fever, leg weakness, difficulty with or loss of control of your urine or bowels, abdominal pain, or more severe pain, you will need to seek medical attention and  / or return to the Emergency  department.

## 2022-06-28 ENCOUNTER — Telehealth: Payer: Self-pay | Admitting: General Practice

## 2022-06-28 LAB — BASIC METABOLIC PANEL
BUN: 24 — AB (ref 4–21)
CO2: 28 — AB (ref 13–22)
Chloride: 108 (ref 99–108)
Creatinine: 2.1 — AB (ref 0.6–1.3)
Glucose: 110
Potassium: 4.2 mEq/L (ref 3.5–5.1)
Sodium: 145 (ref 137–147)

## 2022-06-28 LAB — CBC AND DIFFERENTIAL
HCT: 41 (ref 41–53)
Hemoglobin: 14 (ref 13.5–17.5)
Platelets: 476 10*3/uL — AB (ref 150–400)
WBC: 11.4

## 2022-06-28 LAB — HEPATIC FUNCTION PANEL
ALT: 14 U/L (ref 10–40)
AST: 13 — AB (ref 14–40)
Alkaline Phosphatase: 88 (ref 25–125)
Bilirubin, Total: 0.6

## 2022-06-28 LAB — COMPREHENSIVE METABOLIC PANEL
Albumin: 4.4 (ref 3.5–5.0)
Calcium: 10.4 (ref 8.7–10.7)
Globulin: 2.6
eGFR: 36

## 2022-06-28 LAB — CBC: RBC: 4.58 (ref 3.87–5.11)

## 2022-06-28 NOTE — Telephone Encounter (Signed)
Transition Care Management Follow-up Telephone Call Date of discharge and from where: 06/24/22 from [facility] How have you been since you were released from the hospital? Doing a little better.  Any questions or concerns? No  Items Reviewed: Did the pt receive and understand the discharge instructions provided? Yes  Medications obtained and verified? No  Other? No  Any new allergies since your discharge? No  Dietary orders reviewed? Yes Do you have support at home? Yes   Home Care and Equipment/Supplies: Were home health services ordered? no  Functional Questionnaire: (I = Independent and D = Dependent) ADLs: I  Bathing/Dressing- I  Meal Prep- I  Eating- I  Maintaining continence- I  Transferring/Ambulation- I  Managing Meds- I  Follow up appointments reviewed:  PCP Hospital f/u appt confirmed? No   Specialist Hospital f/u appt confirmed? Yes  Scheduled to see the orthopedics on 06/28/22. Are transportation arrangements needed? No  If their condition worsens, is the pt aware to call PCP or go to the Emergency Dept.? Yes Was the patient provided with contact information for the PCP's office or ED? Yes Was to pt encouraged to call back with questions or concerns? Yes

## 2022-07-13 ENCOUNTER — Telehealth: Payer: Self-pay

## 2022-07-13 ENCOUNTER — Encounter: Payer: Self-pay | Admitting: Medical-Surgical

## 2022-07-14 NOTE — Telephone Encounter (Signed)
Message sent to patient via Mychart as stated by Samuel Bouche , NP.

## 2022-07-16 ENCOUNTER — Telehealth: Payer: Self-pay | Admitting: Medical-Surgical

## 2022-07-16 NOTE — Telephone Encounter (Signed)
Called patient and scheduled follow up appointment on 07/26/2022 at 11:10am with Samuel Bouche, NP to discuss Address [facility]  per patient request. Patient also has some concerns regarding his lab levels and is requesting a call back.

## 2022-07-26 ENCOUNTER — Encounter: Payer: Self-pay | Admitting: Medical-Surgical

## 2022-07-26 ENCOUNTER — Telehealth (INDEPENDENT_AMBULATORY_CARE_PROVIDER_SITE_OTHER): Payer: Self-pay | Admitting: Medical-Surgical

## 2022-07-26 DIAGNOSIS — D72829 Elevated white blood cell count, unspecified: Secondary | ICD-10-CM

## 2022-07-26 DIAGNOSIS — I10 Essential (primary) hypertension: Secondary | ICD-10-CM

## 2022-07-26 DIAGNOSIS — G4733 Obstructive sleep apnea (adult) (pediatric): Secondary | ICD-10-CM

## 2022-07-26 DIAGNOSIS — N289 Disorder of kidney and ureter, unspecified: Secondary | ICD-10-CM

## 2022-07-26 MED ORDER — AMBULATORY NON FORMULARY MEDICATION: 0 refills | Status: DC

## 2022-07-26 MED ORDER — CARVEDILOL 6.25 MG PO TABS: 6.2500 mg | ORAL_TABLET | Freq: Two times a day (BID) | ORAL | 1 refills | Status: DC

## 2022-07-26 NOTE — Progress Notes (Signed)
Virtual Visit via Video Note  I connected with Scott Joseph on 07/26/22 at 11:10 AM EST by a video enabled telemedicine application and verified that I am speaking with the correct person using two identifiers.   I discussed the limitations of evaluation and management by telemedicine and the availability of in person appointments. The patient expressed understanding and agreed to proceed.  Patient location: home Provider locations: office  Subjective:    CC: Lab follow-up  HPI: Pleasant 52 year old male presenting via MyChart video visit follow-up on recent labs done as part of a preop workup.  There were 2 concerning results that warranted further investigation.  His white blood cell count was elevated at 11.4 thousand.  He had no signs or symptoms of infection but had been having a lot of inflammation in his knee.  Additionally, his renal function has continued to worsen. Creatinine has gone from 1.74 three months ago to 2.1 with the lab draw about 4 weeks ago. His BUN was also elevated and his GFR was down to 36.  Since those results came back, he was instructed to discontinue all NSAIDs.  He has not been taking any meloxicam, ibuprofen, Aleve, Advil, Motrin, etc. over the past month.  Has been working to stay very well-hydrated.  Notes that over the past couple of days, he has had some upper respiratory symptoms including sinus congestion and pressure in the frontal sinus area.  Has not felt great.  Has not tested for COVID.  Sleep study completed in October 2023 showed severe obstructive sleep apnea.  Notes that he has not heard anything about a CPAP machine and would like to see if we can expedite getting him one.   Past medical history, Surgical history, Family history not pertinant except as noted below, Social history, Allergies, and medications have been entered into the medical record, reviewed, and corrections made.   Review of Systems: See HPI for pertinent positives and  negatives.   Objective:    General: Speaking clearly in complete sentences without any shortness of breath.  Alert and oriented x3.  Normal judgment. No apparent acute distress.  Impression and Recommendations:    1. Essential hypertension Refilling carvedilol. - carvedilol (COREG) 6.25 MG tablet; Take 1 tablet (6.25 mg total) by mouth 2 (two) times daily.  Dispense: 180 tablet; Refill: 1  2. Leukocytosis, unspecified type Rechecking blood work for evaluation of elevated white blood cells.  Suspect this may be room in relation to untreated severe sleep apnea versus inflammation.  With recent symptoms over the past couple of days, would like for him to COVID test as well. - CBC with Differential/Platelet - Sed Rate (ESR) - C-reactive protein  3. Kidney function abnormal Rechecking kidney function.  Continue to avoid NSAIDs and stay very well-hydrated.  If kidney function has continued to worsen, we may need to get him in with nephrology.  He is agreeable to this and would prefer a [place] location. - BASIC METABOLIC PANEL WITH GFR  I discussed the assessment and treatment plan with the patient. The patient was provided an opportunity to ask questions and all were answered. The patient agreed with the plan and demonstrated an understanding of the instructions.   The patient was advised to call back or seek an in-person evaluation if the symptoms worsen or if the condition fails to improve as anticipated.  25 minutes of non-face-to-face time was provided during this encounter.  Return if symptoms worsen or fail to improve.  Clearnce Sorrel,  DNP, APRN, FNP-BC [place] [facility]

## 2022-07-27 LAB — CBC WITH DIFFERENTIAL/PLATELET
Absolute Monocytes: 1109 cells/uL — ABNORMAL HIGH (ref 200–950)
Basophils Absolute: 79 cells/uL (ref 0–200)
Basophils Relative: 0.9 %
Eosinophils Absolute: 334 cells/uL (ref 15–500)
Eosinophils Relative: 3.8 %
HCT: 41.8 % (ref 38.5–50.0)
Hemoglobin: 14.3 g/dL (ref 13.2–17.1)
Lymphs Abs: 1681 cells/uL (ref 850–3900)
MCH: 30.6 pg (ref 27.0–33.0)
MCHC: 34.2 g/dL (ref 32.0–36.0)
MCV: 89.3 fL (ref 80.0–100.0)
MPV: 11.9 fL (ref 7.5–12.5)
Monocytes Relative: 12.6 %
Neutro Abs: 5597 cells/uL (ref 1500–7800)
Neutrophils Relative %: 63.6 %
Platelets: 444 10*3/uL — ABNORMAL HIGH (ref 140–400)
RBC: 4.68 10*6/uL (ref 4.20–5.80)
RDW: 14.9 % (ref 11.0–15.0)
Total Lymphocyte: 19.1 %
WBC: 8.8 10*3/uL (ref 3.8–10.8)

## 2022-07-27 LAB — BASIC METABOLIC PANEL WITH GFR
BUN/Creatinine Ratio: 19 (calc) (ref 6–22)
BUN: 30 mg/dL — ABNORMAL HIGH (ref 7–25)
CO2: 26 mmol/L (ref 20–32)
Calcium: 10 mg/dL (ref 8.6–10.3)
Chloride: 102 mmol/L (ref 98–110)
Creat: 1.6 mg/dL — ABNORMAL HIGH (ref 0.70–1.30)
Glucose, Bld: 87 mg/dL (ref 65–99)
Potassium: 4.2 mmol/L (ref 3.5–5.3)
Sodium: 138 mmol/L (ref 135–146)
eGFR: 51 mL/min/{1.73_m2} — ABNORMAL LOW (ref >=60)

## 2022-07-27 LAB — SEDIMENTATION RATE: Sed Rate: 11 mm/h (ref 0–20)

## 2022-07-27 LAB — C-REACTIVE PROTEIN: CRP: 6.9 mg/L (ref <8.0)

## 2022-08-08 ENCOUNTER — Encounter: Payer: Self-pay | Admitting: Medical-Surgical

## 2022-08-09 MED ORDER — AMBULATORY NON FORMULARY MEDICATION: 0 refills | Status: DC

## 2022-08-09 NOTE — Addendum Note (Signed)
Addended by: Beverlee Nims on: 08/09/2022 03:24 PM   Modules accepted: Orders

## 2022-08-09 NOTE — Telephone Encounter (Signed)
The CPAP was ordered but Rx was never printed for me to send to [place]. I have printed the Rx and sent to [facility] at [phone]

## 2022-08-17 NOTE — Progress Notes (Signed)
Patient interviewed over the phone. He is coming in for labs 08/23/22 at 1000. Went over instructions over the phone and will provide copy when he comes in for labs  COVID Vaccine Completed: yes  Date of COVID positive in last 90 days: no  PCP - Samuel Bouche, NP Cardiologist - n/a  Medical clearance by Samuel Bouche  05/17/22 in Epic   Chest x-ray - n/a EKG - need one 08/23/22 Stress Test - 4 years ago per pt ECHO - n/a Cardiac Cath - n/a Pacemaker/ICD device last checked: n/a Spinal Cord Stimulator: n/a  Bowel Prep - no  Sleep Study - yes CPAP - waiting on  getting machine   Fasting Blood Sugar - n/a Checks Blood Sugar _____ times a day  Last dose of GLP1 agonist-  N/A GLP1 instructions:  N/A   Last dose of SGLT-2 inhibitors-  N/A SGLT-2 instructions: N/A  Blood Thinner Instructions: n/a Aspirin Instructions: Last Dose:  Activity level: Can go up a flight of stairs and perform activities of daily living without stopping and without symptoms of chest pain or shortness of breath.   Anesthesia review:   Patient denies shortness of breath, fever, cough and chest pain at PAT appointment  Patient verbalized understanding of instructions that were given to them at the PAT appointment. Patient was also instructed that they will need to review over the PAT instructions again at home before surgery.

## 2022-08-17 NOTE — Patient Instructions (Signed)
SURGICAL WAITING ROOM VISITATION  Patients having surgery or a procedure may have no more than 2 support people in the waiting area - these visitors may rotate.    Children under the age of 52 must have an adult with them who is not the patient.  Due to an increase in RSV and influenza rates and associated hospitalizations, children ages 11 and under may not visit patients in [place].  If the patient needs to stay at the hospital during part of their recovery, the visitor guidelines for inpatient rooms apply. Pre-op nurse will coordinate an appropriate time for 1 support person to accompany patient in pre-op.  This support person may not rotate.    Please refer to the [facility] website for the visitor guidelines for Inpatients (after your surgery is over and you are in a regular room).     Your procedure is scheduled on: 08/31/22   Report to [facility] Main Entrance    Report to admitting at 10:55 AM   Call this number if you have problems the morning of surgery [phone]   Do not eat food :After Midnight.   After Midnight you may have the following liquids until 10:25 AM DAY OF SURGERY  Water Non-Citrus Juices (without pulp, NO RED-Apple, White grape, White cranberry) Black Coffee (NO MILK/CREAM OR CREAMERS, sugar ok)  Clear Tea (NO MILK/CREAM OR CREAMERS, sugar ok) regular and decaf                             Plain Jell-O (NO RED)                                           Fruit ices (not with fruit pulp, NO RED)                                     Popsicles (NO RED)                                                               Sports drinks like Gatorade (NO RED)              The day of surgery:  Drink ONE (1) Pre-Surgery Clear Ensure at 10:25 AM the morning of surgery. Drink in one sitting. Do not sip.  This drink was given to you during your hospital  pre-op appointment visit. Nothing else to drink after completing the  Pre-Surgery Clear  Ensure.          If you have questions, please contact your surgeon's office.   FOLLOW BOWEL PREP AND ANY ADDITIONAL PRE OP INSTRUCTIONS YOU RECEIVED FROM YOUR SURGEON'S OFFICE!!!     Oral Hygiene is also important to reduce your risk of infection.                                    Remember - BRUSH YOUR TEETH THE MORNING OF SURGERY WITH YOUR REGULAR TOOTHPASTE  DENTURES WILL BE REMOVED  PRIOR TO SURGERY PLEASE DO NOT APPLY "Poly grip" OR ADHESIVES!!!   Take these medicines the morning of surgery with A SIP OF WATER: Allopurinol, Amlodipine, Atorvastatin, Carvedilol, Zyrtec, Escitalopram  Bring CPAP mask and tubing day of surgery.                              You may not have any metal on your body including jewelry, and body piercing             Do not wear lotions, powders, cologne, or deodorant  Do not shave  48 hours prior to surgery.               Men may shave face and neck.   Do not bring valuables to the hospital. [place].   Contacts, glasses, dentures or bridgework may not be worn into surgery.  DO NOT [place]. PHARMACY WILL DISPENSE MEDICATIONS LISTED ON YOUR MEDICATION LIST TO YOU DURING YOUR ADMISSION [place]!    Patients discharged on the day of surgery will not be allowed to drive home.  Someone NEEDS to stay with you for the first 24 hours after anesthesia.              Please read over the following fact sheets you were given: IF [place] [phone] Schneiders    If you received a COVID test during your pre-op visit  it is requested that you wear a mask when out in public, stay away from anyone that may not be feeling well and notify your surgeon if you develop symptoms. If you test positive for Covid or have been in contact with anyone that has tested positive in the last 10 days please notify you surgeon.    [facility] - Preparing for Surgery Before surgery, you can play an important role.  Because skin is not sterile, your skin needs to be as free of germs as possible.  You can reduce the number of germs on your skin by washing with CHG (chlorahexidine gluconate) soap before surgery.  CHG is an antiseptic cleaner which kills germs and bonds with the skin to continue killing germs even after washing. Please DO NOT use if you have an allergy to CHG or antibacterial soaps.  If your skin becomes reddened/irritated stop using the CHG and inform your nurse when you arrive at Short Stay. Do not shave (including legs and underarms) for at least 48 hours prior to the first CHG shower.  You may shave your face/neck.  Please follow these instructions carefully:  1.  Shower with CHG Soap the night before surgery and the  morning of surgery.  2.  If you choose to wash your hair, wash your hair first as usual with your normal  shampoo.  3.  After you shampoo, rinse your hair and body thoroughly to remove the shampoo.                             4.  Use CHG as you would any other liquid soap.  You can apply chg directly to the skin and wash.  Gently with a scrungie or clean washcloth.  5.  Apply the CHG Soap to your  body ONLY FROM THE NECK DOWN.   Do   not use on face/ open                           Wound or open sores. Avoid contact with eyes, ears mouth and   genitals (private parts).                       Wash face,  Genitals (private parts) with your normal soap.             6.  Wash thoroughly, paying special attention to the area where your    surgery  will be performed.  7.  Thoroughly rinse your body with warm water from the neck down.  8.  DO NOT shower/wash with your normal soap after using and rinsing off the CHG Soap.                9.  Pat yourself dry with a clean towel.            10.  Wear clean pajamas.            11.  Place clean sheets on your bed the night of your first shower and do not  sleep with  pets. Day of Surgery : Do not apply any lotions/deodorants the morning of surgery.  Please wear clean clothes to the hospital/surgery center.  FAILURE TO FOLLOW THESE INSTRUCTIONS MAY RESULT IN THE CANCELLATION OF YOUR SURGERY  PATIENT SIGNATURE_________________________________  NURSE SIGNATURE__________________________________  ________________________________________________________________________  Adam Phenix  An incentive spirometer is a tool that can help keep your lungs clear and active. This tool measures how well you are filling your lungs with each breath. Taking long deep breaths may help reverse or decrease the chance of developing breathing (pulmonary) problems (especially infection) following: A long period of time when you are unable to move or be active. BEFORE THE PROCEDURE  If the spirometer includes an indicator to show your best effort, your nurse or respiratory therapist will set it to a desired goal. If possible, sit up straight or lean slightly forward. Try not to slouch. Hold the incentive spirometer in an upright position. INSTRUCTIONS FOR USE  Sit on the edge of your bed if possible, or sit up as far as you can in bed or on a chair. Hold the incentive spirometer in an upright position. Breathe out normally. Place the mouthpiece in your mouth and seal your lips tightly around it. Breathe in slowly and as deeply as possible, raising the piston or the ball toward the top of the column. Hold your breath for 3-5 seconds or for as long as possible. Allow the piston or ball to fall to the bottom of the column. Remove the mouthpiece from your mouth and breathe out normally. Rest for a few seconds and repeat Steps 1 through 7 at least 10 times every 1-2 hours when you are awake. Take your time and take a few normal breaths between deep breaths. The spirometer may include an indicator to show your best effort. Use the indicator as a goal to work toward during  each repetition. After each set of 10 deep breaths, practice coughing to be sure your lungs are clear. If you have an incision (the cut made at the time of surgery), support your incision when coughing by placing a pillow or rolled up towels firmly against it. Once you are able to get out  of bed, walk around indoors and cough well. You may stop using the incentive spirometer when instructed by your caregiver.  RISKS AND COMPLICATIONS Take your time so you do not get dizzy or light-headed. If you are in pain, you may need to take or ask for pain medication before doing incentive spirometry. It is harder to take a deep breath if you are having pain. AFTER USE Rest and breathe slowly and easily. It can be helpful to keep track of a log of your progress. Your caregiver can provide you with a simple table to help with this. If you are using the spirometer at home, follow these instructions: [place] IF:  You are having difficultly using the spirometer. You have trouble using the spirometer as often as instructed. Your pain medication is not giving enough relief while using the spirometer. You develop fever of 100.5 F (38.1 C) or higher. SEEK IMMEDIATE MEDICAL CARE IF:  You cough up bloody sputum that had not been present before. You develop fever of 102 F (38.9 C) or greater. You develop worsening pain at or near the incision site. MAKE SURE YOU:  Understand these instructions. Will watch your condition. Will get help right away if you are not doing well or get worse. Document Released: 11/08/2006 Document Revised: 09/20/2011 Document Reviewed: 01/09/2007 [facility] Patient Information 2014 [place], [place].   ________________________________________________________________________

## 2022-08-19 ENCOUNTER — Encounter (HOSPITAL_COMMUNITY): Payer: Self-pay

## 2022-08-19 ENCOUNTER — Encounter (HOSPITAL_COMMUNITY)
Admission: RE | Admit: 2022-08-19 | Discharge: 2022-08-19 | Disposition: A | Discharge status: Home / Self Care | Payer: Self-pay | Source: Ambulatory Visit | Attending: Orthopedic Surgery | Admitting: Orthopedic Surgery

## 2022-08-19 DIAGNOSIS — I10 Essential (primary) hypertension: Secondary | ICD-10-CM

## 2022-08-23 ENCOUNTER — Encounter (HOSPITAL_COMMUNITY)
Admission: RE | Admit: 2022-08-23 | Discharge: 2022-08-23 | Disposition: A | Discharge status: Home / Self Care | Payer: 59 | Source: Ambulatory Visit | Attending: Orthopedic Surgery | Admitting: Orthopedic Surgery

## 2022-08-23 VITALS — BP 142/88 | HR 81 | Temp 97.7°F | Resp 18 | Ht 62.0 in | Wt 156.0 lb

## 2022-08-23 DIAGNOSIS — I10 Essential (primary) hypertension: Secondary | ICD-10-CM | POA: Diagnosis not present

## 2022-08-23 DIAGNOSIS — Z01818 Encounter for other preprocedural examination: Secondary | ICD-10-CM | POA: Diagnosis present

## 2022-08-23 LAB — BASIC METABOLIC PANEL
Anion gap: 6 (ref 5–15)
BUN: 22 mg/dL — ABNORMAL HIGH (ref 6–20)
CO2: 26 mmol/L (ref 22–32)
Calcium: 9.7 mg/dL (ref 8.9–10.3)
Chloride: 107 mmol/L (ref 98–111)
Creatinine, Ser: 1.55 mg/dL — ABNORMAL HIGH (ref 0.61–1.24)
GFR, Estimated: 53 mL/min — ABNORMAL LOW (ref >60)
Glucose, Bld: 81 mg/dL (ref 70–99)
Potassium: 4.3 mmol/L (ref 3.5–5.1)
Sodium: 139 mmol/L (ref 135–145)

## 2022-08-23 LAB — CBC
HCT: 42.3 % (ref 39.0–52.0)
Hemoglobin: 13.8 g/dL (ref 13.0–17.0)
MCH: 30 pg (ref 26.0–34.0)
MCHC: 32.6 g/dL (ref 30.0–36.0)
MCV: 92 fL (ref 80.0–100.0)
Platelets: 422 10*3/uL — ABNORMAL HIGH (ref 150–400)
RBC: 4.6 MIL/uL (ref 4.22–5.81)
RDW: 15.2 % (ref 11.5–15.5)
WBC: 7.3 10*3/uL (ref 4.0–10.5)
nRBC: 0 % (ref 0.0–0.2)

## 2022-08-23 LAB — SURGICAL PCR SCREEN
MRSA, PCR: NEGATIVE
Staphylococcus aureus: POSITIVE — AB

## 2022-08-23 NOTE — Progress Notes (Signed)
[facility]+ results routed to Dr. Mardelle Matte

## 2022-08-30 ENCOUNTER — Encounter (HOSPITAL_COMMUNITY): Payer: Self-pay | Admitting: Orthopedic Surgery

## 2022-08-30 NOTE — Anesthesia Preprocedure Evaluation (Signed)
Anesthesia Evaluation  Patient identified by MRN, date of birth, ID band Patient awake    Reviewed: Allergy & Precautions, NPO status , Patient's Chart, lab work & pertinent test results, reviewed documented beta blocker date and time   Airway Mallampati: III   Neck ROM: Limited  Mouth opening: Limited Mouth Opening  Dental no notable dental hx. (+) Teeth Intact   Pulmonary sleep apnea and Continuous Positive Airway Pressure Ventilation  Does not have CPAP machine yet   Pulmonary exam normal breath sounds clear to auscultation       Cardiovascular hypertension, Pt. on medications and Pt. on home beta blockers  Rhythm:Regular Rate:Normal  EKG 08/23/22 NSR, cannot R/O anterior infarct   Neuro/Psych Seizures -,   Anxiety     Remote Hx/o Seizures    GI/Hepatic negative GI ROS, Neg liver ROS,,,  Endo/Other  Hyperlipidemia  Renal/GU negative Renal ROS  negative genitourinary   Musculoskeletal  (+) Arthritis , Osteoarthritis and Rheumatoid disorders,  DJD right knee   Abdominal   Peds  Hematology negative hematology ROS (+)   Anesthesia Other Findings   Reproductive/Obstetrics                              Anesthesia Physical Anesthesia Plan  ASA: 2  Anesthesia Plan: Spinal   Post-op Pain Management: Minimal or no pain anticipated and Regional block*   Induction: Intravenous  PONV Risk Score and Plan: 2 and Treatment may vary due to age or medical condition, Propofol infusion, Midazolam, Ondansetron and Dexamethasone  Airway Management Planned: Natural Airway and Simple Face Mask  Additional Equipment: None  Intra-op Plan:   Post-operative Plan:   Informed Consent: I have reviewed the patients History and Physical, chart, labs and discussed the procedure including the risks, benefits and alternatives for the proposed anesthesia with the patient or authorized representative who has  indicated his/her understanding and acceptance.     Dental advisory given  Plan Discussed with: CRNA and Anesthesiologist  Anesthesia Plan Comments:          Anesthesia Quick Evaluation

## 2022-08-30 NOTE — H&P (Signed)
KNEE ARTHROPLASTY ADMISSION H&P  Patient ID: Scott Joseph MRN: [id number] DOB/AGE: 1971-05-30 52 y.o.  Chief Complaint: right knee pain.  Planned Procedure Date: 08/31/22 Medical Clearance by Samuel Bouche, DNP   HPI: Scott Joseph is a 52 y.o. male who presents for evaluation of right knee DJD. The patient has a history of pain and functional disability in the right knee due to arthritis and has failed non-surgical conservative treatments for greater than 12 weeks to include NSAID's and/or analgesics, corticosteriod injections, viscosupplementation injections, and activity modification.  Onset of symptoms was gradual, starting 2 years ago with gradually worsening course since that time. The patient noted no past surgery on the right knee.  Patient currently rates pain at 6 out of 10 with activity. Patient has worsening of pain with activity and weight bearing and pain that interferes with activities of daily living.  Patient has evidence of joint space narrowing by imaging studies.  There is no active infection.  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 Authorizing Provider  acidophilus (RISAQUAD) CAPS capsule Take 1 capsule by mouth daily.   Yes [provider]  allopurinol (ZYLOPRIM) 300 MG tablet Take 1 tablet (300 mg total) by mouth daily. 04/05/22  Yes Samuel Bouche, NP  amLODipine (NORVASC) 10 MG tablet Take 1 tablet (10 mg total) by mouth daily. 05/17/22  Yes Samuel Bouche, NP  atorvastatin  (LIPITOR) 40 MG tablet Take 1 tablet (40 mg total) by mouth daily. 04/05/22  Yes Samuel Bouche, NP  carvedilol (COREG) 6.25 MG tablet Take 1 tablet (6.25 mg total) by mouth 2 (two) times daily. 07/26/22  Yes Samuel Bouche, NP  cetirizine (ZYRTEC) 10 MG tablet Take 1 tablet (10 mg total) by mouth daily. 04/05/22  Yes Samuel Bouche, NP  escitalopram (LEXAPRO) 10 MG tablet Take 1 tablet (10 mg total) by mouth daily. 05/17/22  Yes Samuel Bouche, NP  fluticasone (FLONASE) 50 MCG/ACT nasal spray Place 2 sprays into both nostrils daily. Patient taking differently: Place 2 sprays into both nostrils daily as needed for allergies. 04/05/22  Yes Samuel Bouche, NP  lidocaine 4 % Place 1 patch onto the skin daily. Patient taking differently: Place 1 patch onto the skin daily as needed (pain). 06/24/22  Yes Prince Rome, PA-C  lisinopril (ZESTRIL) 10 MG tablet Take 1 tablet (10 mg total) by mouth daily. 04/19/22  Yes Samuel Bouche, NP  lisinopril (ZESTRIL) 20 MG tablet Take 1 tablet (20 mg total) by mouth daily. 04/05/22  Yes Samuel Bouche, NP  Multiple Vitamin (MULTIVITAMIN PO) Take 1 tablet by mouth daily.   Yes [provider]  Multiple Vitamins-Minerals (HAIR SKIN & NAILS) TABS Take 1 tablet by mouth daily.   Yes [provider]  AMBULATORY NON FORMULARY MEDICATION Continuous positive airway pressure (CPAP) machine set on AutoPAP (4-20 cmH2O), with all supplemental supplies as needed. 08/09/22   Samuel Bouche, NP  meloxicam (MOBIC) 15 MG tablet One tab PO qAM with a meal for 2 weeks, then daily prn pain. Patient not taking: Reported on 07/26/2022 03/03/21   Samuel Bouche, NP  Sodium Hyaluronate (EUFLEXXA) 20 MG/2ML SOSY Inject 1 syringe into the right knee weekly x3 12/10/21   Silverio Decamp, MD  traMADol (ULTRAM) 50 MG tablet Take 1 tablet (50 mg total) by mouth 3 (three) times daily as needed. Patient not taking: Reported on 08/17/2022 04/05/22   Silverio Decamp, MD  triazolam (HALCION) 0.25 MG  tablet 1-2 tabs PO 2 hours before procedure or imaging.  Do not drive with this medication. Patient not taking: Reported on 08/17/2022 09/21/21   Silverio Decamp, MD   Social History   Socioeconomic History   Marital status: Single    Spouse name: Not on file   Number of children: Not on file   Years of education: Not on file   Highest education level: Not on file  Occupational History   Not on file  Tobacco Use   Smoking status: Never   Smokeless tobacco: Never  Vaping Use   Vaping Use: Never used  Substance and Sexual Activity   Alcohol use: Yes    Comment: Rarely   Drug use: Never   Sexual activity: Yes    Partners: Female    Birth control/protection: None  Other Topics Concern   Not on file  Social History Narrative   Not on file   Social Determinants of Health   Financial Resource Strain: Not on file  Food Insecurity: Not on file  Transportation Needs: Not on file  Physical Activity: Not on file  Stress: Not on file  Social Connections: Not on file   Family History  Problem Relation Age of Onset   Hypertension Mother    CAD Mother    Hypertension Father    Lung cancer Father    Diabetes Sister    Diabetes Brother    Hypertension Maternal Grandmother    Hypertension Maternal Grandfather    Hypertension Paternal Grandmother    Hypertension Paternal Grandfather    Diabetes Paternal Grandfather    Colon cancer Neg Hx    Esophageal cancer Neg Hx    Rectal cancer Neg Hx    Stomach cancer Neg Hx     ROS: Currently denies lightheadedness, dizziness, Fever, chills, CP, SOB.   No personal history of DVT, PE, MI, or CVA. No loose teeth or dentures All other systems have been reviewed and were otherwise currently negative with the exception of those mentioned in the HPI and as above.  Objective: Vitals: Ht: 5' 5"$  Wt: 162 lbs Temp: 98.2 BP: 139/90 Pulse: 83 O2 96% on room air.   Physical Exam: General: Alert, NAD.  Antalgic Gait  HEENT: EOMI, Good Neck  Extension  Pulm: No increased work of breathing.  Clear B/L A/P w/o crackle or wheeze.  CV: RRR, No m/g/r appreciated  GI: soft, NT, ND Neuro: Neuro without gross focal deficit.  Sensation intact distally Skin: No lesions in the area of chief complaint MSK/Surgical Site: right  knee w/o redness or effusion.  Medial and lateral JLT. ROM 15-90.  5/5 strength in extension and flexion.  +EHL/FHL.  NVI.  Stable varus and valgus stress.    Imaging Review Plain radiographs demonstrate severe degenerative joint disease of the right knee.   The overall alignment is varus. The bone quality appears to be adequate for age and reported activity level.  Preoperative templating of  the joint replacement has been completed, documented, and submitted to the Operating Room personnel in order to optimize intra-operative equipment management.  Assessment: right knee DJD    Plan: Plan for Procedure(s): TOTAL KNEE ARTHROPLASTY  The patient history, physical exam, clinical judgement of the provider and imaging are consistent with end stage degenerative joint disease and total joint arthroplasty is deemed medically necessary. The treatment options including medical management, injection therapy, and arthroplasty were discussed at length. The risks and benefits of Procedure(s): TOTAL KNEE ARTHROPLASTY were presented and reviewed.  The risks of nonoperative treatment, versus surgical intervention including but not limited to continued pain, aseptic loosening, stiffness, dislocation/subluxation, infection, bleeding, nerve injury, blood clots, cardiopulmonary complications, morbidity, mortality, among others were discussed. The patient verbalizes understanding and wishes to proceed with the plan.  Patient is being admitted for inpatient treatment for surgery, pain control, PT, prophylactic antibiotics, VTE prophylaxis, progressive ambulation, ADL's and discharge planning.   Dental prophylaxis discussed and  recommended for 2 years postoperatively.  The patient does meet the criteria for TXA which will be used perioperatively.   ASA 325 mg will be used postoperatively for DVT prophylaxis in addition to SCDs, and early ambulation. The patient is planning to be discharged home with [place], PA-C 08/30/2022 10:29 AM

## 2022-08-31 ENCOUNTER — Encounter (HOSPITAL_COMMUNITY)
Admission: RE | Disposition: A | Discharge status: Home / Self Care | Payer: Self-pay | Source: Ambulatory Visit | Attending: Orthopedic Surgery

## 2022-08-31 ENCOUNTER — Other Ambulatory Visit: Payer: Self-pay

## 2022-08-31 ENCOUNTER — Ambulatory Visit (HOSPITAL_BASED_OUTPATIENT_CLINIC_OR_DEPARTMENT_OTHER): Payer: 59 | Admitting: Anesthesiology

## 2022-08-31 ENCOUNTER — Ambulatory Visit (HOSPITAL_COMMUNITY)
Admission: RE | Admit: 2022-08-31 | Discharge: 2022-08-31 | Disposition: A | Discharge status: Home / Self Care | Payer: 59 | Source: Ambulatory Visit | Attending: Orthopedic Surgery | Admitting: Orthopedic Surgery

## 2022-08-31 ENCOUNTER — Encounter (HOSPITAL_COMMUNITY): Payer: Self-pay | Admitting: Orthopedic Surgery

## 2022-08-31 ENCOUNTER — Ambulatory Visit (HOSPITAL_COMMUNITY): Payer: 59 | Admitting: Anesthesiology

## 2022-08-31 DIAGNOSIS — M1711 Unilateral primary osteoarthritis, right knee: Secondary | ICD-10-CM

## 2022-08-31 DIAGNOSIS — I10 Essential (primary) hypertension: Secondary | ICD-10-CM | POA: Insufficient documentation

## 2022-08-31 DIAGNOSIS — G4730 Sleep apnea, unspecified: Secondary | ICD-10-CM | POA: Diagnosis not present

## 2022-08-31 HISTORY — PX: TOTAL KNEE ARTHROPLASTY: SHX125

## 2022-08-31 SURGERY — ARTHROPLASTY, KNEE, TOTAL
Anesthesia: Spinal | Site: Knee | Laterality: Right

## 2022-08-31 MED ORDER — ACETAMINOPHEN 500 MG PO TABS
1000.0000 mg | ORAL_TABLET | Freq: Once | ORAL | Status: AC
  Administered 2022-08-31: 1000 mg via ORAL
  Filled 2022-08-31: qty 2

## 2022-08-31 MED ORDER — SENNA-DOCUSATE SODIUM 8.6-50 MG PO TABS: 2.0000 | ORAL_TABLET | Freq: Every day | ORAL | 1 refills | Status: DC

## 2022-08-31 MED ORDER — ONDANSETRON HCL 4 MG/2ML IJ SOLN: 4.0000 mg | Freq: Once | INTRAMUSCULAR | Status: DC | PRN

## 2022-08-31 MED ORDER — WATER FOR IRRIGATION, STERILE IR SOLN
Status: DC | PRN
  Administered 2022-08-31: 2000 mL

## 2022-08-31 MED ORDER — OXYCODONE HCL 5 MG PO TABS: 5.0000 mg | ORAL_TABLET | Freq: Once | ORAL | Status: DC | PRN

## 2022-08-31 MED ORDER — MIDAZOLAM HCL 2 MG/2ML IJ SOLN
INTRAMUSCULAR | Status: AC
  Filled 2022-08-31: qty 2

## 2022-08-31 MED ORDER — ORAL CARE MOUTH RINSE: 15.0000 mL | Freq: Once | OROMUCOSAL | Status: AC

## 2022-08-31 MED ORDER — KETOROLAC TROMETHAMINE 30 MG/ML IJ SOLN
INTRAMUSCULAR | Status: AC
  Filled 2022-08-31: qty 1

## 2022-08-31 MED ORDER — BUPIVACAINE-EPINEPHRINE (PF) 0.25% -1:200000 IJ SOLN
INTRAMUSCULAR | Status: AC
  Filled 2022-08-31: qty 30

## 2022-08-31 MED ORDER — POVIDONE-IODINE 7.5 % EX SOLN: Freq: Once | CUTANEOUS | Status: DC

## 2022-08-31 MED ORDER — ROPIVACAINE HCL 5 MG/ML IJ SOLN
INTRAMUSCULAR | Status: DC | PRN
  Administered 2022-08-31: 30 mL via PERINEURAL

## 2022-08-31 MED ORDER — ASPIRIN 325 MG PO TBEC: 325.0000 mg | DELAYED_RELEASE_TABLET | Freq: Two times a day (BID) | ORAL | 0 refills | Status: DC

## 2022-08-31 MED ORDER — PROPOFOL 1000 MG/100ML IV EMUL
INTRAVENOUS | Status: AC
  Filled 2022-08-31: qty 100

## 2022-08-31 MED ORDER — PROPOFOL 10 MG/ML IV BOLUS
INTRAVENOUS | Status: DC | PRN
  Administered 2022-08-31: 30 mg via INTRAVENOUS

## 2022-08-31 MED ORDER — LIDOCAINE HCL (PF) 2 % IJ SOLN
INTRAMUSCULAR | Status: AC
  Filled 2022-08-31: qty 5

## 2022-08-31 MED ORDER — POVIDONE-IODINE 10 % EX SWAB: 2.0000 | Freq: Once | CUTANEOUS | Status: DC

## 2022-08-31 MED ORDER — FENTANYL CITRATE (PF) 100 MCG/2ML IJ SOLN
INTRAMUSCULAR | Status: DC | PRN
  Administered 2022-08-31: 50 ug via INTRAVENOUS

## 2022-08-31 MED ORDER — PHENYLEPHRINE HCL-NACL 20-0.9 MG/250ML-% IV SOLN
INTRAVENOUS | Status: DC | PRN
  Administered 2022-08-31: 50 ug/min via INTRAVENOUS

## 2022-08-31 MED ORDER — OXYCODONE HCL 5 MG/5ML PO SOLN: 5.0000 mg | Freq: Once | ORAL | Status: DC | PRN

## 2022-08-31 MED ORDER — LACTATED RINGERS IV BOLUS
250.0000 mL | Freq: Once | INTRAVENOUS | Status: AC
  Administered 2022-08-31: 250 mL via INTRAVENOUS

## 2022-08-31 MED ORDER — POVIDONE-IODINE 10 % EX SWAB
2.0000 | Freq: Once | CUTANEOUS | Status: AC
  Administered 2022-08-31: 2 via TOPICAL

## 2022-08-31 MED ORDER — LACTATED RINGERS IV BOLUS
500.0000 mL | Freq: Once | INTRAVENOUS | Status: AC
  Administered 2022-08-31: 500 mL via INTRAVENOUS

## 2022-08-31 MED ORDER — TRANEXAMIC ACID-NACL 1000-0.7 MG/100ML-% IV SOLN
1000.0000 mg | INTRAVENOUS | Status: AC
  Administered 2022-08-31: 1000 mg via INTRAVENOUS
  Filled 2022-08-31: qty 100

## 2022-08-31 MED ORDER — KETOROLAC TROMETHAMINE 30 MG/ML IJ SOLN
INTRAMUSCULAR | Status: DC | PRN
  Administered 2022-08-31: 30 mg

## 2022-08-31 MED ORDER — ONDANSETRON HCL 4 MG/2ML IJ SOLN
INTRAMUSCULAR | Status: DC | PRN
  Administered 2022-08-31: 4 mg via INTRAVENOUS

## 2022-08-31 MED ORDER — OXYCODONE HCL 5 MG PO TABS: 5.0000 mg | ORAL_TABLET | ORAL | 0 refills | Status: DC | PRN

## 2022-08-31 MED ORDER — SODIUM CHLORIDE 0.9 % IR SOLN
Status: DC | PRN
  Administered 2022-08-31: 1000 mL

## 2022-08-31 MED ORDER — CEFAZOLIN SODIUM-DEXTROSE 2-4 GM/100ML-% IV SOLN
2.0000 g | INTRAVENOUS | Status: AC
  Administered 2022-08-31: 2 g via INTRAVENOUS
  Filled 2022-08-31: qty 100

## 2022-08-31 MED ORDER — LACTATED RINGERS IV SOLN: INTRAVENOUS | Status: DC

## 2022-08-31 MED ORDER — PROPOFOL 500 MG/50ML IV EMUL
INTRAVENOUS | Status: DC | PRN
  Administered 2022-08-31: 90 ug/kg/min via INTRAVENOUS

## 2022-08-31 MED ORDER — CLONIDINE HCL (ANALGESIA) 100 MCG/ML EP SOLN
EPIDURAL | Status: DC | PRN
  Administered 2022-08-31: 100 ug

## 2022-08-31 MED ORDER — BUPIVACAINE IN DEXTROSE 0.75-8.25 % IT SOLN
INTRATHECAL | Status: DC | PRN
  Administered 2022-08-31: 1.6 mL via INTRATHECAL

## 2022-08-31 MED ORDER — PHENYLEPHRINE HCL-NACL 20-0.9 MG/250ML-% IV SOLN
INTRAVENOUS | Status: AC
  Filled 2022-08-31: qty 250

## 2022-08-31 MED ORDER — LIDOCAINE HCL (PF) 2 % IJ SOLN
INTRAMUSCULAR | Status: DC | PRN
  Administered 2022-08-31: 2.5 mL via INTRADERMAL

## 2022-08-31 MED ORDER — FENTANYL CITRATE (PF) 100 MCG/2ML IJ SOLN
INTRAMUSCULAR | Status: AC
  Filled 2022-08-31: qty 2

## 2022-08-31 MED ORDER — MIDAZOLAM HCL 2 MG/2ML IJ SOLN
INTRAMUSCULAR | Status: DC | PRN
  Administered 2022-08-31: 2 mg via INTRAVENOUS

## 2022-08-31 MED ORDER — ONDANSETRON HCL 4 MG/2ML IJ SOLN
INTRAMUSCULAR | Status: AC
  Filled 2022-08-31: qty 2

## 2022-08-31 MED ORDER — LACTATED RINGERS IV BOLUS: 250.0000 mL | Freq: Once | INTRAVENOUS | Status: DC

## 2022-08-31 MED ORDER — HYDROMORPHONE HCL 1 MG/ML IJ SOLN: 0.2500 mg | INTRAMUSCULAR | Status: DC | PRN

## 2022-08-31 MED ORDER — CHLORHEXIDINE GLUCONATE 0.12 % MT SOLN
15.0000 mL | Freq: Once | OROMUCOSAL | Status: AC
  Administered 2022-08-31: 15 mL via OROMUCOSAL

## 2022-08-31 MED ORDER — PHENYLEPHRINE HCL (PRESSORS) 10 MG/ML IV SOLN
INTRAVENOUS | Status: DC | PRN
  Administered 2022-08-31 (×2): 80 ug via INTRAVENOUS

## 2022-08-31 MED ORDER — PROPOFOL 500 MG/50ML IV EMUL
INTRAVENOUS | Status: AC
  Filled 2022-08-31: qty 50

## 2022-08-31 MED ORDER — ONDANSETRON HCL 4 MG PO TABS: 4.0000 mg | ORAL_TABLET | Freq: Three times a day (TID) | ORAL | 0 refills | Status: DC | PRN

## 2022-08-31 MED ORDER — BUPIVACAINE HCL 0.25 % IJ SOLN
INTRAMUSCULAR | Status: DC | PRN
  Administered 2022-08-31: 30 mL

## 2022-08-31 MED ORDER — DEXAMETHASONE SODIUM PHOSPHATE 10 MG/ML IJ SOLN
INTRAMUSCULAR | Status: AC
  Filled 2022-08-31: qty 1

## 2022-08-31 SURGICAL SUPPLY — 53 items
ATTUNE PS FEM RT SZ 4 CEM KNEE (Femur) IMPLANT
BAG COUNTER SPONGE SURGICOUNT (BAG) IMPLANT
BAG ZIPLOCK 12X15 (MISCELLANEOUS) IMPLANT
BASEPLATE TIB CMT FB PCKT SZ4 (Stem) IMPLANT
BLADE SAG 18X100X1.27 (BLADE) ×1 IMPLANT
BLADE SAW SGTL 11.0X1.19X90.0M (BLADE) IMPLANT
BLADE SAW SGTL 13X75X1.27 (BLADE) ×1 IMPLANT
BLADE SURG 15 STRL LF DISP TIS (BLADE) ×1 IMPLANT
BLADE SURG 15 STRL SS (BLADE) ×1
BNDG ELASTIC 6X10 VLCR STRL LF (GAUZE/BANDAGES/DRESSINGS) ×1 IMPLANT
BNDG ELASTIC 6X15 VLCR STRL LF (GAUZE/BANDAGES/DRESSINGS) IMPLANT
BOWL SMART MIX CTS (DISPOSABLE) ×1 IMPLANT
CEMENT HV SMART SET (Cement) ×2 IMPLANT
CLSR STERI-STRIP ANTIMIC 1/2X4 (GAUZE/BANDAGES/DRESSINGS) ×2 IMPLANT
COVER SURGICAL LIGHT HANDLE (MISCELLANEOUS) ×1 IMPLANT
CUFF TOURN SGL QUICK 34 (TOURNIQUET CUFF) ×1
CUFF TRNQT CYL 34X4.125X (TOURNIQUET CUFF) ×1 IMPLANT
DERMABOND ADVANCED .7 DNX12 (GAUZE/BANDAGES/DRESSINGS) IMPLANT
DRAPE SHEET LG 3/4 BI-LAMINATE (DRAPES) ×1 IMPLANT
DRAPE U-SHAPE 47X51 STRL (DRAPES) ×1 IMPLANT
DRSG MEPILEX POST OP 4X12 (GAUZE/BANDAGES/DRESSINGS) ×1 IMPLANT
DURAPREP 26ML APPLICATOR (WOUND CARE) ×2 IMPLANT
ELECT REM PT RETURN 15FT ADLT (MISCELLANEOUS) ×1 IMPLANT
GAUZE PAD ABD 8X10 STRL (GAUZE/BANDAGES/DRESSINGS) ×2 IMPLANT
GLOVE BIO SURGEON STRL SZ 6.5 (GLOVE) ×1 IMPLANT
GLOVE BIO SURGEON STRL SZ7.5 (GLOVE) ×1 IMPLANT
GLOVE BIOGEL PI IND STRL 7.0 (GLOVE) ×1 IMPLANT
GLOVE BIOGEL PI IND STRL 8 (GLOVE) ×1 IMPLANT
GOWN STRL SURGICAL XL XLNG (GOWN DISPOSABLE) ×2 IMPLANT
HANDPIECE INTERPULSE COAX TIP (DISPOSABLE) ×1
HOLDER FOLEY CATH W/STRAP (MISCELLANEOUS) IMPLANT
IMMOBILIZER KNEE 20 (SOFTGOODS) ×1
IMMOBILIZER KNEE 20 THIGH 36 (SOFTGOODS) ×1 IMPLANT
INSERT TIB ATTUNE FB SZ4X5 (Insert) IMPLANT
KIT TURNOVER KIT A (KITS) IMPLANT
MANIFOLD NEPTUNE II (INSTRUMENTS) ×1 IMPLANT
NS IRRIG 1000ML POUR BTL (IV SOLUTION) ×1 IMPLANT
PACK TOTAL KNEE CUSTOM (KITS) ×1 IMPLANT
PATELLA MEDIAL ATTUN 35MM KNEE (Knees) IMPLANT
PIN STEINMAN FIXATION KNEE (PIN) IMPLANT
PROTECTOR NERVE ULNAR (MISCELLANEOUS) ×1 IMPLANT
SET HNDPC FAN SPRY TIP SCT (DISPOSABLE) ×1 IMPLANT
SET PAD KNEE POSITIONER (MISCELLANEOUS) ×1 IMPLANT
SPIKE FLUID TRANSFER (MISCELLANEOUS) IMPLANT
SUT VIC AB 1 CT1 36 (SUTURE) ×2 IMPLANT
SUT VIC AB 2-0 CT1 27 (SUTURE) ×2
SUT VIC AB 2-0 CT1 TAPERPNT 27 (SUTURE) ×1 IMPLANT
SUT VIC AB 3-0 SH 27 (SUTURE)
SUT VIC AB 3-0 SH 27X BRD (SUTURE) IMPLANT
TRAY FOLEY MTR SLVR 16FR STAT (SET/KITS/TRAYS/PACK) ×1 IMPLANT
TUBE SUCTION HIGH CAP CLEAR NV (SUCTIONS) ×1 IMPLANT
WATER STERILE IRR 1000ML POUR (IV SOLUTION) ×2 IMPLANT
WRAP KNEE MAXI GEL POST OP (GAUZE/BANDAGES/DRESSINGS) ×1 IMPLANT

## 2022-08-31 NOTE — Transfer of Care (Signed)
Immediate Anesthesia Transfer of Care Note  Patient: Scott Joseph  Procedure(s) Performed: TOTAL KNEE ARTHROPLASTY (Right: Knee)  Patient Location: PACU  Anesthesia Type:Spinal  Level of Consciousness: awake, oriented, drowsy, and patient cooperative  Airway & Oxygen Therapy: Patient Spontanous Breathing and Patient connected to face mask oxygen  Post-op Assessment: Report given to RN and Post -op Vital signs reviewed and stable  Post vital signs: Reviewed and stable  Last Vitals:  Vitals Value Taken Time  BP    Temp    Pulse    Resp    SpO2      Last Pain:  Vitals:   08/31/22 0612  TempSrc: Oral  PainSc:          Complications: No notable events documented.

## 2022-08-31 NOTE — Evaluation (Signed)
Physical Therapy Evaluation Patient Details Name: Scott Joseph MRN: [id number] DOB: October 11, 1970 Today's Date: 08/31/2022  History of Present Illness  52 yo male presents to therapy s/p R TKA on 08/31/2022 due to failure of conservative measures. Pt has pmh including but not limited to RA, Reye's syndrome, HTN, OSA and B THA.  Clinical Impression  Scott Joseph is a 52 y.o. male POD 0 s/p R TKA. Patient reports IND with mobility at baseline. Patient is now limited by functional impairments (see PT problem list below) and requires min guard for transfers and gait with RW. Patient was able to ambulate 40 feet x 2 with RW and min guard and cues for safe walker management. Patient educated on safe sequencing for stair mobility and verbalized safe guarding position for people assisting with mobility. Patient instructed in exercises to facilitate ROM and circulation. Patient will benefit from continued skilled PT interventions to address impairments and progress towards PLOF. Patient has met mobility goals at adequate level for discharge home; will continue to follow if pt continues acute stay to progress towards Mod I goals.        Recommendations for follow up therapy are one component of a multi-disciplinary discharge planning process, led by the attending physician.  Recommendations may be updated based on patient status, additional functional criteria and insurance authorization.  Follow Up Recommendations Other (comment) (Pt reports will start OPPT 2/22)      Assistance Recommended at Discharge Intermittent Supervision/Assistance  Patient can return home with the following  A little help with walking and/or transfers;A little help with bathing/dressing/bathroom;Assistance with cooking/housework;Assist for transportation;Help with stairs or ramp for entrance    Equipment Recommendations Rolling walker (2 wheels) (provided and adjusted at eval)  Recommendations for Other Services        Functional Status Assessment Patient has had a recent decline in their functional status and demonstrates the ability to make significant improvements in function in a reasonable and predictable amount of time.     Precautions / Restrictions Precautions Precautions: Knee;Fall Restrictions Weight Bearing Restrictions: No      Mobility  Bed Mobility Overal bed mobility: Modified Independent                  Transfers Overall transfer level: Needs assistance Equipment used: Rolling walker (2 wheels) Transfers: Sit to/from Stand Sit to Stand: Min guard           General transfer comment: cues for proper UE and RLE placement with bed and recliner transfers    Ambulation/Gait Ambulation/Gait assistance: Min guard Gait Distance (Feet): 40 Feet Assistive device: Rolling walker (2 wheels) Gait Pattern/deviations: Step-to pattern, Antalgic Gait velocity: decreased        Stairs Stairs: Yes Stairs assistance: Min guard Stair Management: Two rails Number of Stairs: 2 General stair comments: pt reported wanting to try steps incase pt is required to navigate curb to access apartment. pt required cues for safety and technique with pt making attempts for reciprocal pattern vs step to  Wheelchair Mobility    Modified Rankin (Stroke Patients Only)       Balance Overall balance assessment: Needs assistance Sitting-balance support: No upper extremity supported, Feet unsupported Sitting balance-Leahy Scale: Fair     Standing balance support: No upper extremity supported Standing balance-Leahy Scale: Fair                               Pertinent Vitals/Pain Pain Assessment Pain  Assessment: No/denies pain Pain Intervention(s): Ice applied, Monitored during session (pt denied pain t/o Eval)    Home Living Family/patient expects to be discharged to:: Private residence Living Arrangements: Spouse/significant other Available Help at Discharge:  Family Type of Home: Apartment Home Access: Elevator;Level entry       Home Layout: One level        Prior Function Prior Level of Function : Independent/Modified Independent;Working/employed;Driving             Mobility Comments: pt reports IND with ADLs, self care tasks, driving and No AD       Hand Dominance        Extremity/Trunk Assessment        Lower Extremity Assessment Lower Extremity Assessment: RLE deficits/detail RLE Deficits / Details: ankle DF/PF 5/5, hip flexion 4+/5 RLE Sensation: WNL       Communication   Communication: No difficulties  Cognition Arousal/Alertness: Awake/alert Behavior During Therapy: WFL for tasks assessed/performed Overall Cognitive Status: Within Functional Limits for tasks assessed                                          General Comments      Exercises Total Joint Exercises Ankle Circles/Pumps: AROM, Both, 20 reps Quad Sets: AROM, Right, 5 reps Short Arc Quad: AROM, Right, 5 reps Heel Slides: AROM, Right, 5 reps Hip ABduction/ADduction: AROM, Right, 5 reps Straight Leg Raises: AROM, Right, 5 reps   Assessment/Plan    PT Assessment Patient needs continued PT services  PT Problem List Decreased strength;Decreased range of motion;Decreased activity tolerance;Decreased balance;Decreased mobility;Decreased knowledge of use of DME       PT Treatment Interventions DME instruction;Gait training;Stair training;Functional mobility training;Therapeutic activities;Therapeutic exercise;Balance training;Neuromuscular re-education;Patient/family education;Modalities    PT Goals (Current goals can be found in the Care Plan section)  Acute Rehab PT Goals Patient Stated Goal: get moving and return to work without pain PT Goal Formulation: With patient Time For Goal Achievement: 09/14/22 Potential to Achieve Goals: Good    Frequency       Co-evaluation               AM-PAC PT "6 Clicks" Mobility   Outcome Measure Help needed turning from your back to your side while in a flat bed without using bedrails?: A Little Help needed moving from lying on your back to sitting on the side of a flat bed without using bedrails?: A Little Help needed moving to and from a bed to a chair (including a wheelchair)?: A Little Help needed standing up from a chair using your arms (e.g., wheelchair or bedside chair)?: A Little Help needed to walk in hospital room?: A Little Help needed climbing 3-5 steps with a railing? : A Little 6 Click Score: 18    End of Session Equipment Utilized During Treatment: Gait belt Activity Tolerance: No increased pain Patient left: in chair;with call bell/phone within reach;with family/visitor present Nurse Communication: Mobility status;Other (comment) (progression with d/c home) PT Visit Diagnosis: Unsteadiness on feet (R26.81);Other abnormalities of gait and mobility (R26.89);Muscle weakness (generalized) (M62.81)    Time: [id number] PT Time Calculation (min) (ACUTE ONLY): 42 min   Charges:   PT Evaluation $PT Eval Low Complexity: 1 Low PT Treatments $Gait Training: 8-22 mins $Therapeutic Exercise: 8-22 mins        Baird Lyons, PT   Adair Patter 08/31/2022, 4:23 PM

## 2022-08-31 NOTE — Anesthesia Procedure Notes (Signed)
Spinal  Patient location during procedure: OR Start time: 08/31/2022 7:43 AM End time: 08/31/2022 7:47 AM Reason for block: surgical anesthesia Staffing Performed: anesthesiologist  Anesthesiologist: Josephine Igo, MD Performed by: Josephine Igo, MD Authorized by: Josephine Igo, MD   Preanesthetic Checklist Completed: patient identified, IV checked, site marked, risks and benefits discussed, surgical consent, monitors and equipment checked, pre-op evaluation and timeout performed Spinal Block Patient position: sitting Prep: DuraPrep and site prepped and draped Patient monitoring: heart rate, cardiac monitor, continuous pulse ox and blood pressure Approach: midline Location: L3-4 Injection technique: single-shot Needle Needle type: Pencan  Needle gauge: 24 G Needle length: 9 cm Needle insertion depth: 7 cm Assessment Sensory level: T6 Events: CSF return Additional Notes Patient tolerated procedure well. Adequate sensory level.

## 2022-08-31 NOTE — Discharge Instructions (Signed)
INSTRUCTIONS AFTER JOINT REPLACEMENT   Remove items at home which could result in a fall. This includes throw rugs or furniture in walking pathways ICE to the affected joint every three hours while awake for 30 minutes at a time, for at least the first 3-5 days, and then as needed for pain and swelling.  Continue to use ice for pain and swelling. You may notice swelling that will progress down to the foot and ankle.  This is normal after surgery.  Elevate your leg when you are not up walking on it.   Continue to use the breathing machine you got in the hospital (incentive spirometer) which will help keep your temperature down.  It is common for your temperature to cycle up and down following surgery, especially at night when you are not up moving around and exerting yourself.  The breathing machine keeps your lungs expanded and your temperature down.   DIET:  As you were doing prior to hospitalization, we recommend a well-balanced diet.  DRESSING / WOUND CARE / SHOWERING  You may shower 3 days after surgery, but keep the wounds dry during showering.  You may use an occlusive plastic wrap (Press'n Seal for example), NO SOAKING/SUBMERGING IN THE BATHTUB.  If the bandage gets wet, change with a clean dry gauze.  If the incision gets wet, pat the wound dry with a clean towel. You may remove your ace wrap after 24 hours. After removing your ace wrap, please use your ted hose during the day until your follow up visit, you may take off at night.  ACTIVITY  Increase activity slowly as tolerated, but follow the weight bearing instructions below.   No driving for 6 weeks or until further direction given by your physician.  You cannot drive while taking narcotics.  No lifting or carrying greater than 10 lbs. until further directed by your surgeon. Avoid periods of inactivity such as sitting longer than an hour when not asleep. This helps prevent blood clots.  You may return to work once you are authorized by  your doctor.     WEIGHT BEARING   Weight bearing as tolerated with assist device (walker, cane, etc) as directed, use it as long as suggested by your surgeon or therapist, typically at least 4-6 weeks.   EXERCISES  Results after joint replacement surgery are often greatly improved when you follow the exercise, range of motion and muscle strengthening exercises prescribed by your doctor. Safety measures are also important to protect the joint from further injury. Any time any of these exercises cause you to have increased pain or swelling, decrease what you are doing until you are comfortable again and then slowly increase them. If you have problems or questions, call your caregiver or physical therapist for advice.   Rehabilitation is important following a joint replacement. After just a few days of immobilization, the muscles of the leg can become weakened and shrink (atrophy).  These exercises are designed to build up the tone and strength of the thigh and leg muscles and to improve motion. Often times heat used for twenty to thirty minutes before working out will loosen up your tissues and help with improving the range of motion but do not use heat for the first two weeks following surgery (sometimes heat can increase post-operative swelling).   These exercises can be done on a training (exercise) mat, on the floor, on a table or on a bed. Use whatever works the best and is most comfortable for  you.    Use music or television while you are exercising so that the exercises are a pleasant break in your day. This will make your life better with the exercises acting as a break in your routine that you can look forward to.   Perform all exercises about fifteen times, three times per day or as directed.  You should exercise both the operative leg and the other leg as well.  Exercises include:   Quad Sets - Tighten up the muscle on the front of the thigh (Quad) and hold for 5-10 seconds.   Straight  Leg Raises - With your knee straight (if you were given a brace, keep it on), lift the leg to 60 degrees, hold for 3 seconds, and slowly lower the leg.  Perform this exercise against resistance later as your leg gets stronger.  Leg Slides: Lying on your back, slowly slide your foot toward your buttocks, bending your knee up off the floor (only go as far as is comfortable). Then slowly slide your foot back down until your leg is flat on the floor again.  Angel Wings: Lying on your back spread your legs to the side as far apart as you can without causing discomfort.  Hamstring Strength:  Lying on your back, push your heel against the floor with your leg straight by tightening up the muscles of your buttocks.  Repeat, but this time bend your knee to a comfortable angle, and push your heel against the floor.  You may put a pillow under the heel to make it more comfortable if necessary.   A rehabilitation program following joint replacement surgery can speed recovery and prevent re-injury in the future due to weakened muscles. Contact your doctor or a physical therapist for more information on knee rehabilitation.    CONSTIPATION  Constipation is defined medically as fewer than three stools per week and severe constipation as less than one stool per week.  Even if you have a regular bowel pattern at home, your normal regimen is likely to be disrupted due to multiple reasons following surgery.  Combination of anesthesia, postoperative narcotics, change in appetite and fluid intake all can affect your bowels.   YOU MUST use at least one of the following options; they are listed in order of increasing strength to get the job done.  They are all available over the counter, and you may need to use some, POSSIBLY even all of these options:    Drink plenty of fluids (prune juice may be helpful) and high fiber foods Colace 100 mg by mouth twice a day  Senokot for constipation as directed and as needed Dulcolax  (bisacodyl), take with full glass of water  Miralax (polyethylene glycol) once or twice a day as needed.  If you have tried all these things and are unable to have a bowel movement in the first 3-4 days after surgery call either your surgeon or your primary doctor.    If you experience loose stools or diarrhea, hold the medications until you stool forms back up.  If your symptoms do not get better within 1 week or if they get worse, check with your doctor.  If you experience "the worst abdominal pain ever" or develop nausea or vomiting, please contact the office immediately for further recommendations for treatment.   ITCHING:  If you experience itching with your medications, try taking only a single pain pill, or even half a pain pill at a time.  You can also use  Benadryl over the counter for itching or also to help with sleep.   TED HOSE STOCKINGS:  Use stockings on both legs until for at least 2 weeks or as directed by physician office. They may be removed at night for sleeping.  MEDICATIONS:  See your medication summary on the "After Visit Summary" that nursing will review with you.  You may have some home medications which will be placed on hold until you complete the course of blood thinner medication.  It is important for you to complete the blood thinner medication as prescribed.  PRECAUTIONS:  If you experience chest pain or shortness of breath - call 911 immediately for transfer to the hospital emergency department.   If you develop a fever greater that 101 F, purulent drainage from wound, increased redness or drainage from wound, foul odor from the wound/dressing, or calf pain - CONTACT YOUR SURGEON.                                                   FOLLOW-UP APPOINTMENTS:  If you do not already have a post-op appointment, please call the office for an appointment to be seen by your surgeon.  Guidelines for how soon to be seen are listed in your "After Visit Summary", but are typically  between 1-4 weeks after surgery.  OTHER INSTRUCTIONS:   Knee Replacement:  Do not place pillow under knee, focus on keeping the knee straight while resting.   POST-OPERATIVE OPIOID TAPER INSTRUCTIONS: It is important to wean off of your opioid medication as soon as possible. If you do not need pain medication after your surgery it is ok to stop day one. Opioids include: Codeine, Hydrocodone(Norco, Vicodin), Oxycodone(Percocet, oxycontin) and hydromorphone amongst others.  Long term and even short term use of opiods can cause: Increased pain response Dependence Constipation Depression Respiratory depression And more.  Withdrawal symptoms can include Flu like symptoms Nausea, vomiting And more Techniques to manage these symptoms Hydrate well Eat regular healthy meals Stay active Use relaxation techniques(deep breathing, meditating, yoga) Do Not substitute Alcohol to help with tapering If you have been on opioids for less than two weeks and do not have pain than it is ok to stop all together.  Plan to wean off of opioids This plan should start within one week post op of your joint replacement. Maintain the same interval or time between taking each dose and first decrease the dose.  Cut the total daily intake of opioids by one tablet each day Next start to increase the time between doses. The last dose that should be eliminated is the evening dose.   MAKE SURE YOU:  Understand these instructions.  Get help right away if you are not doing well or get worse.    Thank you for letting us be a part of your medical care team.  It is a privilege we respect greatly.  We hope these instructions will help you stay on track for a fast and full recovery!

## 2022-08-31 NOTE — Op Note (Signed)
DATE OF SURGERY:  08/31/2022 TIME: 9:48 AM  PATIENT NAME:  Scott Joseph   AGE: 52 y.o.    PRE-OPERATIVE DIAGNOSIS: Right knee primary localized osteoarthritis  POST-OPERATIVE DIAGNOSIS:  Same  PROCEDURE: RIGHT total Knee Arthroplasty  SURGEON:  [name], MD   ASSISTANT:  Merlene Pulling, PA-C, present and scrubbed throughout the case, critical for assistance with exposure, retraction, instrumentation, and closure.   OPERATIVE IMPLANTS: Depuy Attune size 4 posterior Stabilized Femur, with a size 4 fixed Bearing Tibia, 5 polyethylene insert with a 35 medialized oval dome polyethylene patella.  PREOPERATIVE INDICATIONS:  Scott Joseph is a 52 y.o. year old male with end stage bone on bone degenerative arthritis of the knee who failed conservative treatment, including injections, antiinflammatories, activity modification, and assistive devices, and had significant impairment of their activities of daily living, and elected for Total Knee Arthroplasty.   The risks, benefits, and alternatives were discussed at length including but not limited to the risks of infection, bleeding, nerve injury, stiffness, blood clots, the need for revision surgery, cardiopulmonary complications, among others, and they were willing to proceed.  OPERATIVE FINDINGS AND UNIQUE ASPECTS OF THE CASE: The medial compartment had significant eburnation.  The patellofemoral joint had grade 4 chondral wear on the medial facet of the patella, although the trochlea itself looked reasonably good.  The lateral compartment was relatively preserved, although his ACL appeared dysfunctional, with encasing osteophyte, and poor quality tissue.  His overall tissue quality was very thick and tight.  He had a very boggy synovium, and his mobility preoperative really was fairly limited, he had a 15 degree flexion contracture, and only bent to 90 degrees at best.  I cut his femur on 10, and even took an additional 2, and cut the tibia on 3  referencing off of the medial side.  ESTIMATED BLOOD LOSS: 100 mL  OPERATIVE DESCRIPTION:  The patient was brought to the operative room and placed in a supine position.  Anesthesia was administered.  IV antibiotics were given.  The lower extremity was prepped and draped in the usual sterile fashion.  Time out was performed.  The leg was elevated and exsanguinated and the tourniquet was inflated.  Anterior quadriceps tendon splitting approach was performed.  The patella was everted and osteophytes were removed.  The anterior horn of the medial and lateral meniscus was removed.   The patella was then measured, and cut with the saw.  The thickness before the cut was 24 and after the cut was 14.5.  A metal shield was used to protect the patella throughout the case.    The distal femur was opened with the drill and the intramedullary distal femoral cutting jig was utilized, set at 5 degrees resecting 10 mm off the distal femur.  Care was taken to protect the collateral ligaments.  Then the extramedullary tibial cutting jig was utilized making the appropriate cut using the anterior tibial crest as a reference building in appropriate posterior slope.  Care was taken during the cut to protect the medial and collateral ligaments.  The proximal tibia was removed along with the posterior horns of the menisci.  The PCL was sacrificed.    The extensor gap was measured and was still too tight, so I took an additional two off of the femur, and then it was found to have adequate resection, measuring to a size 4.    The distal femoral sizing jig was applied, taking care to avoid notching.  This was set  at 3 degrees of external rotation.  Then the 4-in-1 cutting jig was applied and the anterior and posterior femur was cut, along with the chamfer cuts.  All posterior osteophytes were removed.  The flexion gap was then measured and was symmetric with the extension gap.  I completed the distal femoral preparation  using the appropriate jig to prepare the box.  The proximal tibia sized and prepared accordingly with the reamer and the punch, and then all components were trialed with the poly insert.  The knee was found to have excellent balance and full motion.    The above named components were then cemented into place and all excess cement was removed.  The real polyethylene implant was placed.  After the cement had cured I released the tourniquet and confirmed excellent hemostasis with no major posterior vessel injury.    The knee was easily taken through a range of motion and the patella tracked well and the knee irrigated copiously and the parapatellar and subcutaneous tissue closed with vicryl, and monocryl with steri strips for the skin.  The wounds were injected with marcaine, and dressed with sterile gauze and the patient was awakened and returned to the PACU in stable and satisfactory condition.  There were no complications.  Total tourniquet time was 87 minutes.

## 2022-08-31 NOTE — Interval H&P Note (Signed)
History and Physical Interval Note:  08/31/2022 7:12 AM  Scott Joseph  has presented today for surgery, with the diagnosis of right knee DJD.  The various methods of treatment have been discussed with the patient and family. After consideration of risks, benefits and other options for treatment, the patient has consented to  Procedure(s): TOTAL KNEE ARTHROPLASTY (Right) as a surgical intervention.  The patient's history has been reviewed, patient examined, no change in status, stable for surgery.  I have reviewed the patient's chart and labs.  Questions were answered to the patient's satisfaction.     [name]

## 2022-08-31 NOTE — Anesthesia Procedure Notes (Signed)
Anesthesia Regional Block: Adductor canal block   Pre-Anesthetic Checklist: , timeout performed,  Correct Patient, Correct Site, Correct Laterality,  Correct Procedure, Correct Position, site marked,  Risks and benefits discussed,  Surgical consent,  Pre-op evaluation,  At surgeon's request and post-op pain management  Laterality: Right  Prep: chloraprep       Needles:  Injection technique: Single-shot  Needle Type: Echogenic Stimulator Needle     Needle Length: 10cm  Needle Gauge: 21   Needle insertion depth: 7 cm   Additional Needles:   Procedures:,,,, ultrasound used (permanent image in chart),,    Narrative:  Start time: 08/31/2022 7:25 AM End time: 08/31/2022 7:30 AM Injection made incrementally with aspirations every 5 mL.  Performed by: Personally  Anesthesiologist: Josephine Igo, MD  Additional Notes: Timeout performed. Patient sedated. Relevant anatomy ID'd using [place]. Incremental 2-47m injection of LA with frequent aspiration. Patient tolerated procedure well.

## 2022-08-31 NOTE — Anesthesia Postprocedure Evaluation (Signed)
Anesthesia Post Note  Patient: Scott Joseph  Procedure(s) Performed: TOTAL KNEE ARTHROPLASTY (Right: Knee)     Patient location during evaluation: PACU Anesthesia Type: Spinal Level of consciousness: oriented and awake and alert Pain management: pain level controlled Vital Signs Assessment: post-procedure vital signs reviewed and stable Respiratory status: spontaneous breathing, respiratory function stable and nonlabored ventilation Cardiovascular status: blood pressure returned to baseline and stable Postop Assessment: no headache, no backache, no apparent nausea or vomiting, patient able to bend at knees and spinal receding Anesthetic complications: no   No notable events documented.  Last Vitals:  Vitals:   08/31/22 1100 08/31/22 1115  BP: (!) 89/57 (!) 90/59  Pulse: 63 69  Resp: 12 13  Temp: (!) 36.3 C   SpO2: 100% 94%    Last Pain:  Vitals:   08/31/22 1115  TempSrc:   PainSc: Asleep                 Masaji Billups A.

## 2022-09-01 ENCOUNTER — Encounter (HOSPITAL_COMMUNITY): Payer: Self-pay | Admitting: Orthopedic Surgery

## 2022-09-01 ENCOUNTER — Encounter: Payer: Self-pay | Admitting: Medical-Surgical

## 2022-10-04 ENCOUNTER — Ambulatory Visit (INDEPENDENT_AMBULATORY_CARE_PROVIDER_SITE_OTHER): Payer: 59 | Admitting: Medical-Surgical

## 2022-10-04 VITALS — BP 152/91 | HR 73 | Resp 20 | Ht 62.0 in | Wt 157.4 lb

## 2022-10-04 DIAGNOSIS — M109 Gout, unspecified: Secondary | ICD-10-CM

## 2022-10-04 DIAGNOSIS — I10 Essential (primary) hypertension: Secondary | ICD-10-CM | POA: Diagnosis not present

## 2022-10-04 DIAGNOSIS — F419 Anxiety disorder, unspecified: Secondary | ICD-10-CM

## 2022-10-04 DIAGNOSIS — M25475 Effusion, left foot: Secondary | ICD-10-CM

## 2022-10-04 DIAGNOSIS — E782 Mixed hyperlipidemia: Secondary | ICD-10-CM | POA: Diagnosis not present

## 2022-10-04 DIAGNOSIS — Z87898 Personal history of other specified conditions: Secondary | ICD-10-CM

## 2022-10-04 LAB — POCT GLYCOSYLATED HEMOGLOBIN (HGB A1C): Hemoglobin A1C: 5.3 % (ref 4.0–5.6)

## 2022-10-04 MED ORDER — LISINOPRIL 10 MG PO TABS: 10.0000 mg | ORAL_TABLET | Freq: Every day | ORAL | 3 refills | Status: DC

## 2022-10-04 MED ORDER — LISINOPRIL 20 MG PO TABS: 20.0000 mg | ORAL_TABLET | Freq: Every day | ORAL | 1 refills | Status: DC

## 2022-10-04 MED ORDER — LISINOPRIL 40 MG PO TABS: 40.0000 mg | ORAL_TABLET | Freq: Every day | ORAL | 1 refills | Status: DC

## 2022-10-04 MED ORDER — ATORVASTATIN CALCIUM 40 MG PO TABS: 40.0000 mg | ORAL_TABLET | Freq: Every day | ORAL | 1 refills | Status: DC

## 2022-10-04 MED ORDER — ALLOPURINOL 300 MG PO TABS: 300.0000 mg | ORAL_TABLET | Freq: Every day | ORAL | 1 refills | Status: DC

## 2022-10-04 NOTE — Progress Notes (Signed)
Established Patient Office Visit  Subjective   Patient ID: Scott Joseph, male   DOB: 20-Feb-1971 Age: 52 y.o. MRN: [id number]   Chief Complaint  Patient presents with   Follow-up   Hypertension   HPI Pleasant 52 year old male presenting today for the following:  Hypertension: Reports he has been checking his blood pressure at home with readings in the 140/90 area.  Taking amlodipine 10 mg daily, lisinopril 30 mg daily, and carvedilol 6.25 mg twice daily.  Tolerating medications well without side effects.  Following a low-sodium diet.  Activity as tolerated due to recent total knee replacement.  Now has his CPAP machine and is using this nightly.  Denies CP, SOB, palpitations, lower extremity edema, dizziness, headaches, or vision changes.  Gout: No recent gout flares.  Taking allopurinol 300 mg daily, tolerating well without side effects.  Avoids known food triggers.  Mood: Taking Lexapro 10 mg daily, tolerating well without side effects.  Feels the medication is working to keep his mood very stable and is happy with the effects.  Denies SI/HI.  Hyperlipidemia: Taking Lipitor 40 mg daily, tolerating well without side effects.  Following a low-fat heart healthy diet.   Objective:    Vitals:   10/04/22 1113 10/04/22 1142  BP: (!) 160/84 (!) 152/91  Pulse: 73   Resp: 20   Height: 5\' 2"  (1.575 m)   Weight: 157 lb 6.4 oz (71.4 kg)   SpO2: 97%   BMI (Calculated): 28.78     Physical Exam Vitals reviewed.  Constitutional:      General: He is not in acute distress.    Appearance: Normal appearance. He is not ill-appearing.  HENT:     Head: Normocephalic.  Cardiovascular:     Rate and Rhythm: Normal rate.     Pulses: Normal pulses.     Heart sounds: Normal heart sounds. No murmur heard.    No friction rub. No gallop.  Pulmonary:     Effort: Pulmonary effort is normal. No respiratory distress.     Breath sounds: Normal breath sounds.  Skin:    General: Skin is warm and dry.   Neurological:     Mental Status: He is alert and oriented to person, place, and time.  Psychiatric:        Mood and Affect: Mood normal.        Behavior: Behavior normal.        Thought Content: Thought content normal.        Judgment: Judgment normal.    Results for orders placed or performed in visit on 10/04/22 (from the past 24 hour(s))  POCT HgB A1C     Status: Normal   Collection Time: 10/04/22 11:54 AM  Result Value Ref Range   Hemoglobin A1C 5.3 4.0 - 5.6 %   HbA1c POC (<> result, manual entry)     HbA1c, POC (prediabetic range)     HbA1c, POC (controlled diabetic range)         The 10-year ASCVD risk score (Arnett DK, et al., 2019) is: 13.7%   Values used to calculate the score:     Age: 46 years     Sex: Male     Is Non-Hispanic African American: Yes     Diabetic: No     Tobacco smoker: No     Systolic Blood Pressure: 0000000 mmHg     Is BP treated: Yes     HDL Cholesterol: 49 mg/dL     Total Cholesterol: 194 mg/dL  Assessment & Plan:   1. Essential hypertension Blood pressure elevated on arrival and again on recheck.  Home readings have also not been at goal.  Continue amlodipine 10 mg daily and carvedilol 6.25 mg twice daily.  Increasing lisinopril to 40 mg daily.  Monitor blood pressure at home with a goal of 130/80 or less.  If consistently higher, return for further evaluation and medication management.  Continue low-sodium diet and increase exercise as tolerated. - lisinopril (ZESTRIL) 40 MG tablet; Take 1 tablet (40 mg total) by mouth daily.  Dispense: 90 tablet; Refill: 1  2. Mixed hyperlipidemia Continue atorvastatin 40 mg daily. - atorvastatin (LIPITOR) 40 MG tablet; Take 1 tablet (40 mg total) by mouth daily.  Dispense: 90 tablet; Refill: 1  3. Anxiety Stable on current regimen.  Continue Lexapro 10 mg daily.  4. Gout of foot, unspecified cause, unspecified chronicity, unspecified laterality 5. Swelling of first metatarsophalangeal (MTP) joint of  left foot Last check, uric acid looked great.  Continue allopurinol 300 mg daily.  Continue to avoid food triggers.   - allopurinol (ZYLOPRIM) 300 MG tablet; Take 1 tablet (300 mg total) by mouth daily.  Dispense: 90 tablet; Refill: 1  6. History of prediabetes POCT hemoglobin A1c down to 5.3%. - POCT HgB A1C  Return in about 2 weeks (around 10/18/2022) for HTN/CPAP follow.  ___________________________________________ Clearnce Sorrel, DNP, APRN, FNP-BC Primary Care and Sports Medicine [place]

## 2022-10-11 ENCOUNTER — Ambulatory Visit: Payer: 59 | Admitting: Medical-Surgical

## 2022-10-18 ENCOUNTER — Telehealth: Payer: Self-pay | Admitting: Medical-Surgical

## 2022-10-18 ENCOUNTER — Ambulatory Visit: Payer: 59 | Admitting: Medical-Surgical

## 2022-10-18 NOTE — Telephone Encounter (Signed)
Pt called at 3:52 to reschedule his morning appointment. Should I leave him on the schedule to get a No Show?

## 2022-10-25 ENCOUNTER — Ambulatory Visit (INDEPENDENT_AMBULATORY_CARE_PROVIDER_SITE_OTHER): Payer: 59 | Admitting: Medical-Surgical

## 2022-10-25 ENCOUNTER — Encounter: Payer: Self-pay | Admitting: Medical-Surgical

## 2022-10-25 ENCOUNTER — Telehealth: Payer: Self-pay | Admitting: *Deleted

## 2022-10-25 VITALS — BP 150/95 | HR 88 | Temp 98.6°F | Ht 62.0 in | Wt 162.8 lb

## 2022-10-25 DIAGNOSIS — G4733 Obstructive sleep apnea (adult) (pediatric): Secondary | ICD-10-CM | POA: Diagnosis not present

## 2022-10-25 DIAGNOSIS — I10 Essential (primary) hypertension: Secondary | ICD-10-CM

## 2022-10-25 MED ORDER — CARVEDILOL 12.5 MG PO TABS: 12.5000 mg | ORAL_TABLET | Freq: Two times a day (BID) | ORAL | 1 refills | Status: DC

## 2022-10-25 NOTE — Progress Notes (Signed)
        Established patient visit  History, exam, impression, and plan:  Essential hypertension Pleasant 52 year old male presenting today with a history of hypertension that has been poorly controlled recently.  He was discovered to have significant sleep apnea and has started using a CPAP machine.  Continues amlodipine 10 mg, Coreg 6.25 mg twice daily, and lisinopril 40 mg daily.  Tolerating all medications well without side effects.  Monitors his blood pressure at home and reports that recently his readings have still remained higher than the 130/80 goal.  Denies CP, SOB, LE edema, HA, palpitations, dizziness, and vision changes.  On exam, HRR, S1/S2 normal.  Lungs CTA  Respirations even and unlabored.  Blood pressure is not at goal on arrival or on recheck.  Continue amlodipine 10 mg daily, lisinopril 40 mg daily.  Increasing Coreg to 12.5 mg twice daily.  Monitor blood pressure at home with a goal of 130/80 or less.  Reduce dietary sodium and work on weight loss to healthy weight.  Physical activity as tolerated due to orthopedic condition.  Discussed dietary options such as DASH diet or Mediterranean diet.  Severe obstructive sleep apnea Sleep study completed recently showed severe sleep apnea which was expected with his resistant hypertension.  He has been using a CPAP machine for greater than 1 month, tolerating well.  Notes that he has skipped a few nights of using it but most nights he gets well over 4 hours of use.  Feels better in the morning and is no longer significantly fatigued during the day.  Following his compliance using the app associated with his machine.  Notes that he has plenty of supplies and is aware of where to get more.  We are requesting his compliance report from [facility] for upload into the chart.  Recommend using his CPAP every night as well as during the day for any naps that he may take.   Procedures performed this visit: None.  Return in about 2 weeks (around  11/08/2022) for nurse visit for BP check.  __________________________________ Thayer Ohm, DNP, APRN, FNP-BC Primary Care and Sports Medicine [facility] [place]

## 2022-10-25 NOTE — Assessment & Plan Note (Signed)
Pleasant 52 year old male presenting today with a history of hypertension that has been poorly controlled recently.  He was discovered to have significant sleep apnea and has started using a CPAP machine.  Continues amlodipine 10 mg, Coreg 6.25 mg twice daily, and lisinopril 40 mg daily.  Tolerating all medications well without side effects.  Monitors his blood pressure at home and reports that recently his readings have still remained higher than the 130/80 goal.  Denies CP, SOB, LE edema, HA, palpitations, dizziness, and vision changes.  On exam, HRR, S1/S2 normal.  Lungs CTA  Respirations even and unlabored.  Blood pressure is not at goal on arrival or on recheck.  Continue amlodipine 10 mg daily, lisinopril 40 mg daily.  Increasing Coreg to 12.5 mg twice daily.  Monitor blood pressure at home with a goal of 130/80 or less.  Reduce dietary sodium and work on weight loss to healthy weight.  Physical activity as tolerated due to orthopedic condition.  Discussed dietary options such as DASH diet or Mediterranean diet.

## 2022-10-25 NOTE — Assessment & Plan Note (Signed)
Sleep study completed recently showed severe sleep apnea which was expected with his resistant hypertension.  He has been using a CPAP machine for greater than 1 month, tolerating well.  Notes that he has skipped a few nights of using it but most nights he gets well over 4 hours of use.  Feels better in the morning and is no longer significantly fatigued during the day.  Following his compliance using the app associated with his machine.  Notes that he has plenty of supplies and is aware of where to get more.  We are requesting his compliance report from [facility] for upload into the chart.  Recommend using his CPAP every night as well as during the day for any naps that he may take.

## 2022-10-25 NOTE — Telephone Encounter (Signed)
Contacted [facility] to request CPAP Compliance Report.  They will be sending the 90 day report.  It will come via fax with attention for [facility]. Mikiala Fugett Zimmerman Rumple, CMA

## 2022-10-26 ENCOUNTER — Other Ambulatory Visit: Payer: Self-pay | Admitting: Medical-Surgical

## 2022-10-26 ENCOUNTER — Telehealth: Payer: Self-pay | Admitting: *Deleted

## 2022-10-26 DIAGNOSIS — I10 Essential (primary) hypertension: Secondary | ICD-10-CM

## 2022-10-26 MED ORDER — CARVEDILOL 12.5 MG PO TABS: 12.5000 mg | ORAL_TABLET | Freq: Two times a day (BID) | ORAL | 1 refills | Status: DC

## 2022-10-26 NOTE — Telephone Encounter (Signed)
Pharmacy sent request for patients Carvedilol to be a 90 day Rx.  Please advise if this is appropriate. Scott Joseph, CMA

## 2022-11-08 ENCOUNTER — Ambulatory Visit: Payer: 59

## 2022-12-04 ENCOUNTER — Other Ambulatory Visit: Payer: Self-pay | Admitting: Medical-Surgical

## 2022-12-04 DIAGNOSIS — F419 Anxiety disorder, unspecified: Secondary | ICD-10-CM

## 2023-01-26 ENCOUNTER — Ambulatory Visit (INDEPENDENT_AMBULATORY_CARE_PROVIDER_SITE_OTHER): Payer: 59 | Admitting: Medical-Surgical

## 2023-01-26 ENCOUNTER — Encounter: Payer: Self-pay | Admitting: Medical-Surgical

## 2023-01-26 VITALS — BP 128/74 | HR 64 | Resp 20 | Ht 62.0 in | Wt 158.6 lb

## 2023-01-26 DIAGNOSIS — I10 Essential (primary) hypertension: Secondary | ICD-10-CM | POA: Diagnosis not present

## 2023-01-26 DIAGNOSIS — F419 Anxiety disorder, unspecified: Secondary | ICD-10-CM | POA: Diagnosis not present

## 2023-01-26 DIAGNOSIS — J0120 Acute ethmoidal sinusitis, unspecified: Secondary | ICD-10-CM | POA: Diagnosis not present

## 2023-01-26 MED ORDER — AZITHROMYCIN 250 MG PO TABS: ORAL_TABLET | ORAL | 0 refills | Status: AC

## 2023-01-26 MED ORDER — PREDNISONE 50 MG PO TABS: 50.0000 mg | ORAL_TABLET | Freq: Every day | ORAL | 0 refills | Status: DC

## 2023-01-26 MED ORDER — ESCITALOPRAM OXALATE 10 MG PO TABS: 10.0000 mg | ORAL_TABLET | Freq: Every day | ORAL | 1 refills | Status: DC

## 2023-01-26 MED ORDER — CETIRIZINE HCL 10 MG PO TABS: 10.0000 mg | ORAL_TABLET | Freq: Every day | ORAL | 3 refills | Status: DC

## 2023-01-26 NOTE — Progress Notes (Signed)
        Established patient visit  History, exam, impression, and plan:  1. Essential hypertension Pleasant 52 year old male presenting today with a history of hypertension currently on amlodipine 10 mg daily, Coreg 12.5 mg twice daily, and Lisinopril 40mg  daily. Tolerating all medication well without side effects. Following a low sodium diet. Staying active. Denies CP, SOB, palpitations, lower extremity edema, dizziness, headaches, or vision changes. Cardiopulmonary exam normal. BP at goal. Continue current medications.   2. Anxiety Doing well on Lexapro for anxiety and depression. Feels his symptoms are well managed. Denies SI/HI. Continue Lexapro as prescribed.  - escitalopram (LEXAPRO) 10 MG tablet; Take 1 tablet (10 mg total) by mouth daily.  Dispense: 90 tablet; Refill: 1  3. Acute non-recurrent ethmoidal sinusitis Reports that he has chronic issues with sinus pressure behind his eyes but over the past week or two this has turned into pain. Has nighttime sinus congestion that is interfering with wearing his CPAP machine. Not taking Zyrtec consistently and has not been using Flonase. Denies fever, chills, ear pain/pressure, ST, CP, SOB. Tenderness bilaterally noted to the nasal bridge and inner orbital fossa. Treating with Azithromycin and Prednisone. Recommend daily use of Zyrtec and Flonase to better manage chronic sinus issues.   Procedures performed this visit: None.  Return in about 6 months (around 07/29/2023) for chronic disease follow up.  __________________________________ Thayer Ohm, DNP, APRN, FNP-BC Primary Care and Sports Medicine [facility] [place]

## 2023-04-14 ENCOUNTER — Other Ambulatory Visit: Payer: Self-pay

## 2023-04-14 ENCOUNTER — Ambulatory Visit
Admission: EM | Admit: 2023-04-14 | Discharge: 2023-04-14 | Disposition: A | Discharge status: Home / Self Care | Payer: 59 | Attending: Family Medicine | Admitting: Family Medicine

## 2023-04-14 ENCOUNTER — Ambulatory Visit: Payer: 59

## 2023-04-14 DIAGNOSIS — S8001XA Contusion of right knee, initial encounter: Secondary | ICD-10-CM

## 2023-04-14 DIAGNOSIS — M25561 Pain in right knee: Secondary | ICD-10-CM

## 2023-04-14 DIAGNOSIS — M25461 Effusion, right knee: Secondary | ICD-10-CM

## 2023-04-14 DIAGNOSIS — Z96651 Presence of right artificial knee joint: Secondary | ICD-10-CM | POA: Diagnosis not present

## 2023-04-14 MED ORDER — PREDNISONE 50 MG PO TABS: ORAL_TABLET | ORAL | 0 refills | Status: DC

## 2023-04-14 MED ORDER — TRAMADOL-ACETAMINOPHEN 37.5-325 MG PO TABS: 1.0000 | ORAL_TABLET | Freq: Every evening | ORAL | 0 refills | Status: DC | PRN

## 2023-04-14 NOTE — Discharge Instructions (Signed)
Take the prednisone once a day for 5 days Take ultracet at bedtime 1 or 2.  This is a low level narcotic, stronger than tylenol Call Dr Dion Saucier if the buckling feeling comes back

## 2023-04-14 NOTE — ED Provider Notes (Addendum)
[facility]    CSN: [id number] Arrival date & time: 04/14/23  1744      History   Chief Complaint No chief complaint on file.   HPI Scott Joseph is a 52 y.o. male.   Patient had a right total knee arthroplasty performed 8 months ago.  He has been doing very well.  Today his knee buckled and he fell forward landing on his flexed knee.  His knee is swollen and painful.  He is here for evaluation. Patient has rheumatoid arthritis, hypertension, sleep apnea and a history of gout.  Hyperlipidemia controlled on Lipitor.    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- no seizure activity since his teen years   Sleep apnea    in process of getting study and cpap    Patient Active Problem List   Diagnosis Date Noted   Severe obstructive sleep apnea 10/25/2022   Primary osteoarthritis of both knees 10/09/2021   Gout of foot 02/22/2021   Mixed hyperlipidemia 02/22/2021   Swelling of first metatarsophalangeal (MTP) joint of left foot 07/30/2019   Essential hypertension 07/30/2019   Rheumatoid arthritis (HCC) 07/30/2019   Anxiety 07/30/2019    Past Surgical History:  Procedure Laterality Date   JOINT REPLACEMENT     Bilateral Hip Replacement   PLEURAL SCARIFICATION     TOTAL KNEE ARTHROPLASTY Right 08/31/2022   Procedure: TOTAL KNEE ARTHROPLASTY;  Surgeon: Teryl Lucy, MD;  Location: [facility];  Service: Orthopedics;  Laterality: Right;       Home Medications    Prior to Admission medications   Medication Sig Start Date End Date Taking? Authorizing Provider  predniSONE (DELTASONE) 50 MG tablet Take once a day for 5 days.  Take with food 04/14/23  Yes Eustace Moore, MD  traMADol-acetaminophen (ULTRACET) 37.5-325 MG tablet Take 1 tablet by mouth at bedtime as needed and may repeat dose one  time if needed for moderate pain. 04/14/23  Yes Eustace Moore, MD  acidophilus [facility]) CAPS capsule Take 1 capsule by mouth daily.    [provider]  allopurinol (ZYLOPRIM) 300 MG tablet Take 1 tablet (300 mg total) by mouth daily. 10/04/22   Christen Butter, NP  amLODipine (NORVASC) 10 MG tablet Take 1 tablet (10 mg total) by mouth daily. 05/17/22   Christen Butter, NP  atorvastatin (LIPITOR) 40 MG tablet Take 1 tablet (40 mg total) by mouth daily. 10/04/22   Christen Butter, NP  carvedilol (COREG) 12.5 MG tablet Take 1 tablet (12.5 mg total) by mouth 2 (two) times daily with a meal. 10/26/22   Christen Butter, NP  cetirizine (ZYRTEC) 10 MG tablet Take 1 tablet (10 mg total) by mouth daily. 01/26/23   Christen Butter, NP  escitalopram (LEXAPRO) 10 MG tablet Take 1 tablet (10 mg total) by mouth daily. 01/26/23   Christen Butter, NP  fluticasone (FLONASE) 50 MCG/ACT nasal spray Place 2 sprays into both nostrils daily. Patient taking differently: Place 2 sprays into both nostrils daily as needed for allergies. 04/05/22   Christen Butter, NP  lisinopril (ZESTRIL) 40 MG tablet Take 1 tablet (40 mg total) by mouth daily. 10/04/22   Christen Butter, NP  Multiple Vitamin (MULTIVITAMIN PO) Take 1 tablet by mouth daily.    [provider]  Multiple Vitamins-Minerals (HAIR SKIN & NAILS) TABS Take 1 tablet by mouth daily.    [provider]    Family History Family History  Problem Relation Age of Onset   Hypertension Mother    CAD Mother    Hypertension Father    Lung cancer Father    Diabetes Sister    Diabetes Brother    Hypertension Maternal Grandmother    Hypertension Maternal Grandfather    Hypertension Paternal Grandmother    Hypertension Paternal Grandfather    Diabetes Paternal Grandfather    Colon cancer Neg Hx    Esophageal cancer Neg Hx    Rectal cancer Neg Hx    Stomach cancer Neg Hx     Social History Social History   Tobacco Use   Smoking status: Never   Smokeless tobacco:  Never  Vaping Use   Vaping status: Never Used  Substance Use Topics   Alcohol use: Yes    Comment: Rarely   Drug use: Never     Allergies   Patient has no known allergies.   Review of Systems Review of Systems  See HPI Physical Exam Triage Vital Signs ED Triage Vitals  Encounter Vitals Group     BP 04/14/23 1752 (!) 139/90     Systolic BP Percentile --      Diastolic BP Percentile --      Pulse Rate 04/14/23 1752 62     Resp 04/14/23 1752 16     Temp 04/14/23 1752 98.5 F (36.9 C)     Temp src --      SpO2 --      Weight --      Height --      Head Circumference --      Peak Flow --      Pain Score 04/14/23 1753 6     Pain Loc --      Pain Education --      Exclude from Growth Chart --    No data found.  Updated Vital Signs BP (!) 139/90   Pulse 62   Temp 98.5 F (36.9 C)   Resp 16       Physical Exam Constitutional:      General: He is not in acute distress.    Appearance: He is well-developed and normal weight.  HENT:     Head: Normocephalic and atraumatic.  Eyes:     Conjunctiva/sclera: Conjunctivae normal.     Pupils: Pupils are equal, round, and reactive to light.  Cardiovascular:     Rate and Rhythm: Normal rate.  Pulmonary:     Effort: Pulmonary effort is normal. No respiratory distress.  Abdominal:     General: There is no distension.     Palpations: Abdomen is soft.  Musculoskeletal:        General: Swelling, tenderness and signs of injury present. Normal range of motion.     Cervical back: Normal range of motion.     Comments: Right knee has a will healed arthroplasty scar.  Slight soft tissue swelling anterior to patella.  There is also trace effusion.  Definite warmth.  Tenderness in the lateral joint line.  Good range of motion with no instability  Skin:    General: Skin is warm and dry.  Neurological:     Mental Status: He is alert.     Gait: Gait abnormal.      UC Treatments / Results  Labs (all labs ordered are listed,  but only abnormal  results are displayed) Labs Reviewed - No data to display  EKG   Radiology DG Knee AP/LAT W/Sunrise Right  Result Date: 04/14/2023 CLINICAL DATA:  Fall today with knee pain. EXAM: RIGHT KNEE 3 VIEWS COMPARISON:  None Available. FINDINGS: Knee arthroplasty in expected alignment. No periprosthetic lucency. No acute or periprosthetic fracture. Prior patellar resurfacing. There is a small knee joint effusion. Mild prepatellar soft tissue thickening. IMPRESSION: 1. Knee arthroplasty without complication or acute fracture. 2. Small knee joint effusion. Electronically Signed   By: Narda Rutherford M.D.   On: 04/14/2023 18:14    Procedures Procedures (including critical care time)  Medications Ordered in UC Medications - No data to display  Initial Impression / Assessment and Plan / UC Course  I have reviewed the triage vital signs and the nursing notes.  Pertinent labs & imaging results that were available during my care of the patient were reviewed by me and considered in my medical decision making (see chart for details).     I told patient that it is worrisome that he feels his knee is unstable and causing him to fall.  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  I advised him to contact Dr. Dion Saucier for reevaluation, possible additional PT or bracing Final Clinical Impressions(s) / UC Diagnoses   Final diagnoses:  Status post right knee replacement  Contusion of right knee, initial encounter  Knee effusion, right     Discharge Instructions      Take the prednisone once a day for 5 days Take ultracet at bedtime 1 or 2.  This is a low level narcotic, stronger than tylenol Call Dr Dion Saucier if the buckling feeling comes back      ED Prescriptions     Medication Sig Dispense Auth. Provider   predniSONE (DELTASONE) 50 MG tablet Take once a day for 5 days.  Take with food 5 tablet Eustace Moore, MD    traMADol-acetaminophen (ULTRACET) 37.5-325 MG tablet Take 1 tablet by mouth at bedtime as needed and may repeat dose one time if needed for moderate pain. 30 tablet Eustace Moore, MD      I have reviewed the PDMP during this encounter.   Eustace Moore, MD 04/14/23 1850    Eustace Moore, MD 04/14/23 Ebony Cargo

## 2023-04-14 NOTE — ED Triage Notes (Signed)
Right knee buckled and he fell, has pain. Has hx of knee surgery in February of this past year.

## 2023-06-24 ENCOUNTER — Other Ambulatory Visit: Payer: Self-pay

## 2023-06-24 DIAGNOSIS — M25475 Effusion, left foot: Secondary | ICD-10-CM

## 2023-06-24 DIAGNOSIS — M109 Gout, unspecified: Secondary | ICD-10-CM

## 2023-06-24 DIAGNOSIS — I10 Essential (primary) hypertension: Secondary | ICD-10-CM

## 2023-06-24 DIAGNOSIS — E782 Mixed hyperlipidemia: Secondary | ICD-10-CM

## 2023-06-24 MED ORDER — ALLOPURINOL 300 MG PO TABS: 300.0000 mg | ORAL_TABLET | Freq: Every day | ORAL | 0 refills | Status: DC

## 2023-06-24 MED ORDER — LISINOPRIL 40 MG PO TABS: 40.0000 mg | ORAL_TABLET | Freq: Every day | ORAL | 0 refills | Status: DC

## 2023-06-24 MED ORDER — CARVEDILOL 12.5 MG PO TABS: 12.5000 mg | ORAL_TABLET | Freq: Two times a day (BID) | ORAL | 0 refills | Status: DC

## 2023-06-24 MED ORDER — ATORVASTATIN CALCIUM 40 MG PO TABS: 40.0000 mg | ORAL_TABLET | Freq: Every day | ORAL | 0 refills | Status: DC

## 2023-07-07 ENCOUNTER — Ambulatory Visit
Admission: EM | Admit: 2023-07-07 | Discharge: 2023-07-07 | Disposition: A | Discharge status: Home / Self Care | Payer: 59 | Attending: Family Medicine | Admitting: Family Medicine

## 2023-07-07 ENCOUNTER — Encounter: Payer: Self-pay | Admitting: Emergency Medicine

## 2023-07-07 DIAGNOSIS — U071 COVID-19: Secondary | ICD-10-CM | POA: Diagnosis not present

## 2023-07-07 LAB — POC INFLUENZA A AND B ANTIGEN (URGENT CARE ONLY)
Influenza A Ag: NEGATIVE
Influenza B Ag: NEGATIVE

## 2023-07-07 LAB — POC SARS CORONAVIRUS 2 AG -  ED: SARS Coronavirus 2 Ag: POSITIVE — AB

## 2023-07-07 MED ORDER — PAXLOVID (300/100) 20 X 150 MG & 10 X 100MG PO TBPK: 3.0000 | ORAL_TABLET | Freq: Two times a day (BID) | ORAL | 0 refills | Status: DC

## 2023-07-07 NOTE — Discharge Instructions (Signed)
Take the Paxlovid 2 times a day for 5 days Do not take atorvastatin while on Paxlovid Drink lots of fluids Take over-the-counter cough or cold medicines as needed May return to work Monday but must wear a mask for 5 days

## 2023-07-07 NOTE — ED Provider Notes (Signed)
[name]    CSN: [id number] Arrival date & time: 07/07/23  1020      History   Chief Complaint Chief Complaint  Patient presents with   URI    HPI Scott Joseph is a 52 y.o. male.   Exposed to COVID at work.[employer]  Here with a friend and coworker who is also sick with upper respiratory symptoms.  Complains of headache body aches, fever chills, nasal congestion and cough for the last 2 to 3 days.  Over-the-counter medicines not helping.  No underlying asthma or lung disease.  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- no seizure activity since his teen years   Sleep apnea    in process of getting study and cpap    Patient Active Problem List   Diagnosis Date Noted   Severe obstructive sleep apnea 10/25/2022   Primary osteoarthritis of both knees 10/09/2021   Gout of foot 02/22/2021   Mixed hyperlipidemia 02/22/2021   Swelling of first metatarsophalangeal (MTP) joint of left foot 07/30/2019   Essential hypertension 07/30/2019   Rheumatoid arthritis (HCC) 07/30/2019   Anxiety 07/30/2019    Past Surgical History:  Procedure Laterality Date   JOINT REPLACEMENT     Bilateral Hip Replacement   PLEURAL SCARIFICATION     TOTAL KNEE ARTHROPLASTY Right 08/31/2022   Procedure: TOTAL KNEE ARTHROPLASTY;  Surgeon: Teryl Lucy, MD;  Location: [facility];  Service: Orthopedics;  Laterality: Right;       Home Medications    Prior to Admission medications   Medication Sig Start Date End Date Taking? Authorizing Provider  acidophilus (RISAQUAD) CAPS capsule Take 1 capsule by mouth daily.   Yes [provider]  allopurinol (ZYLOPRIM) 300 MG tablet Take 1 tablet (300 mg total) by mouth daily. NEEDS APPOINTMENT FOR FURTHER  REFILLS. 06/24/23  Yes Christen Butter, NP  amLODipine (NORVASC) 10 MG tablet Take 1 tablet (10 mg total) by mouth daily. 05/17/22  Yes Christen Butter, NP  atorvastatin (LIPITOR) 40 MG tablet Take 1 tablet (40 mg total) by mouth daily. NEEDS APPOINTMENT FOR FURTHER REFILLS. 06/24/23  Yes Christen Butter, NP  carvedilol (COREG) 12.5 MG tablet Take 1 tablet (12.5 mg total) by mouth 2 (two) times daily with a meal. NEEDS APPOINTMENT FOR FURTHER REFILLS. 06/24/23  Yes Christen Butter, NP  cetirizine (ZYRTEC) 10 MG tablet Take 1 tablet (10 mg total) by mouth daily. 01/26/23  Yes Christen Butter, NP  escitalopram (LEXAPRO) 10 MG tablet Take 1 tablet (10 mg total) by mouth daily. 01/26/23  Yes Christen Butter, NP  fluticasone (FLONASE) 50 MCG/ACT nasal spray Place 2 sprays into both nostrils daily. Patient taking differently: Place 2 sprays into both nostrils daily as needed for allergies. 04/05/22  Yes Christen Butter, NP  lisinopril (ZESTRIL) 40 MG tablet Take 1 tablet (40 mg total) by mouth daily. NEEDS APPOINTMENT FOR FURTHER REFILLS. 06/24/23  Yes Christen Butter, NP  Multiple Vitamin (MULTIVITAMIN PO) Take 1 tablet by mouth daily.   Yes [provider]  Multiple Vitamins-Minerals (HAIR SKIN & NAILS) TABS Take 1 tablet by mouth daily.   Yes [provider]  nirmatrelvir/ritonavir (PAXLOVID, 300/100,) 20 x 150 MG & 10 x 100MG  TBPK Take  3 tablets by mouth 2 (two) times daily for 5 days. Patient GFR is 51. Take nirmatrelvir (150 mg) two tablets twice daily for 5 days and ritonavir (100 mg) one tablet twice daily for 5 days. 07/07/23 07/12/23 Yes Eustace Moore, MD  traMADol-acetaminophen (ULTRACET) 37.5-325 MG tablet Take 1 tablet by mouth at bedtime as needed and may repeat dose one time if needed for moderate pain. 04/14/23  Yes Eustace Moore, MD  predniSONE (DELTASONE) 50 MG tablet Take once a day for 5 days.  Take with food 04/14/23   Eustace Moore, MD    Family History Family History  Problem Relation  Age of Onset   Hypertension Mother    CAD Mother    Hypertension Father    Lung cancer Father    Diabetes Sister    Diabetes Brother    Hypertension Maternal Grandmother    Hypertension Maternal Grandfather    Hypertension Paternal Grandmother    Hypertension Paternal Grandfather    Diabetes Paternal Grandfather    Colon cancer Neg Hx    Esophageal cancer Neg Hx    Rectal cancer Neg Hx    Stomach cancer Neg Hx     Social History Social History   Tobacco Use   Smoking status: Never   Smokeless tobacco: Never  Vaping Use   Vaping status: Never Used  Substance Use Topics   Alcohol use: Yes    Comment: Rarely   Drug use: Never     Allergies   Patient has no known allergies.   Review of Systems Review of Systems   Physical Exam Triage Vital Signs ED Triage Vitals  Encounter Vitals Group     BP 07/07/23 1123 (!) 169/99     Systolic BP Percentile --      Diastolic BP Percentile --      Pulse Rate 07/07/23 1123 67     Resp 07/07/23 1123 18     Temp 07/07/23 1123 98.4 F (36.9 C)     Temp Source 07/07/23 1123 Oral     SpO2 07/07/23 1123 99 %     Weight 07/07/23 1124 160 lb (72.6 kg)     Height 07/07/23 1124 5\' 2"  (1.575 m)     Head Circumference --      Peak Flow --      Pain Score 07/07/23 1124 0     Pain Loc --      Pain Education --      Exclude from Growth Chart --    No data found.  Updated Vital Signs BP (!) 169/99 (BP Location: Right Arm)   Pulse 67   Temp 98.4 F (36.9 C) (Oral)   Resp 18   Ht 5\' 2"  (1.575 m)   Wt 72.6 kg   SpO2 99%   BMI 29.26 kg/m   Visual Acuity Right Eye Distance:   Left Eye Distance:   Bilateral Distance:    Right Eye Near:   Left Eye Near:    Bilateral Near:     Physical Exam   UC Treatments / Results  Labs (all labs ordered are listed, but only abnormal results are displayed) Labs Reviewed  POC SARS CORONAVIRUS 2 AG -  ED - Abnormal; Notable for the following components:      Result Value   SARS  Coronavirus 2 Ag Positive (*)    All other components within normal limits  POC INFLUENZA A AND B ANTIGEN (URGENT CARE ONLY) - Normal  POC  COVID19/FLU A&B COMBO    EKG   Radiology No results found.  Procedures Procedures (including critical care time)  Medications Ordered in UC Medications - No data to display  Initial Impression / Assessment and Plan / UC Course  I have reviewed the triage vital signs and the nursing notes.  Pertinent labs & imaging results that were available during my care of the patient were reviewed by me and considered in my medical decision making (see chart for details).     COVID test is positive.  Quarantine and mask wearing reviewed Final Clinical Impressions(s) / UC Diagnoses   Final diagnoses:  COVID-19     Discharge Instructions      Take the Paxlovid 2 times a day for 5 days Do not take atorvastatin while on Paxlovid Drink lots of fluids Take over-the-counter cough or cold medicines as needed May return to work Monday but must wear a mask for 5 days     ED Prescriptions     Medication Sig Dispense Auth. Provider   nirmatrelvir/ritonavir (PAXLOVID, 300/100,) 20 x 150 MG & 10 x 100MG  TBPK Take 3 tablets by mouth 2 (two) times daily for 5 days. Patient GFR is 51. Take nirmatrelvir (150 mg) two tablets twice daily for 5 days and ritonavir (100 mg) one tablet twice daily for 5 days. 30 tablet Eustace Moore, MD      PDMP not reviewed this encounter.   Eustace Moore, MD 07/07/23 [phone]

## 2023-07-07 NOTE — ED Notes (Signed)
Wrong POC ordered.  It should've been POC influenza and POC SARS.  Correct orders placed and resulted.

## 2023-07-07 NOTE — ED Triage Notes (Signed)
Patient c/o fever, sore throat, body chills, cough, nasal drainage x 4 days.  Patient has been taken Dayquil, Nyquil, throat spray, vapor rub.

## 2023-07-10 DIAGNOSIS — X58XXXA Exposure to other specified factors, initial encounter: Secondary | ICD-10-CM | POA: Insufficient documentation

## 2023-07-10 DIAGNOSIS — I10 Essential (primary) hypertension: Secondary | ICD-10-CM | POA: Diagnosis not present

## 2023-07-10 DIAGNOSIS — Z79899 Other long term (current) drug therapy: Secondary | ICD-10-CM | POA: Insufficient documentation

## 2023-07-10 DIAGNOSIS — T783XXA Angioneurotic edema, initial encounter: Secondary | ICD-10-CM | POA: Insufficient documentation

## 2023-07-10 DIAGNOSIS — R2230 Localized swelling, mass and lump, unspecified upper limb: Secondary | ICD-10-CM | POA: Diagnosis present

## 2023-07-11 ENCOUNTER — Emergency Department (HOSPITAL_BASED_OUTPATIENT_CLINIC_OR_DEPARTMENT_OTHER)
Admission: EM | Admit: 2023-07-11 | Discharge: 2023-07-11 | Disposition: A | Discharge status: Home / Self Care | Payer: 59 | Attending: Emergency Medicine | Admitting: Emergency Medicine

## 2023-07-11 ENCOUNTER — Encounter (HOSPITAL_BASED_OUTPATIENT_CLINIC_OR_DEPARTMENT_OTHER): Payer: Self-pay

## 2023-07-11 ENCOUNTER — Other Ambulatory Visit: Payer: Self-pay

## 2023-07-11 ENCOUNTER — Telehealth: Payer: Self-pay

## 2023-07-11 DIAGNOSIS — T783XXA Angioneurotic edema, initial encounter: Secondary | ICD-10-CM

## 2023-07-11 DIAGNOSIS — I10 Essential (primary) hypertension: Secondary | ICD-10-CM

## 2023-07-11 MED ORDER — PREDNISONE 10 MG (21) PO TBPK: ORAL_TABLET | ORAL | 0 refills | Status: DC

## 2023-07-11 MED ORDER — DIPHENHYDRAMINE HCL 25 MG PO CAPS
25.0000 mg | ORAL_CAPSULE | Freq: Once | ORAL | Status: AC
  Administered 2023-07-11: 25 mg via ORAL
  Filled 2023-07-11: qty 1

## 2023-07-11 MED ORDER — AMLODIPINE BESYLATE 5 MG PO TABS
10.0000 mg | ORAL_TABLET | Freq: Once | ORAL | Status: AC
  Administered 2023-07-11: 10 mg via ORAL
  Filled 2023-07-11: qty 2

## 2023-07-11 MED ORDER — AMLODIPINE BESYLATE 10 MG PO TABS: 10.0000 mg | ORAL_TABLET | Freq: Every day | ORAL | 1 refills | Status: DC

## 2023-07-11 MED ORDER — PREDNISONE 50 MG PO TABS
60.0000 mg | ORAL_TABLET | Freq: Once | ORAL | Status: AC
  Administered 2023-07-11: 60 mg via ORAL
  Filled 2023-07-11: qty 1

## 2023-07-11 NOTE — ED Notes (Signed)

## 2023-07-11 NOTE — ED Provider Notes (Signed)
[place] [place]  Provider Note  CSN: [id number] Arrival date & time: 07/10/23 2355  History No chief complaint on file.   Scott Joseph is a 52 y.o. male with history of HTN and OSA recently diagnosed with Covid and started on Paxlovid. He has taken three days worth. Reports tonight he noticed swelling of his L lower lip. No tongue swelling, difficulty breathing or swallowing. He is also on lisinopril for HTN. Has been out of Amlodipine, cannot get a refill until PCP appointment in 3 weeks. Otherwise he has been feeling well.    Home Medications Prior to Admission medications   Medication Sig Start Date End Date Taking? Authorizing Provider  predniSONE (STERAPRED UNI-PAK 21 TAB) 10 MG (21) TBPK tablet 10mg  Tabs, 6 day taper. Use as directed 07/11/23  Yes Pollyann Savoy, MD  acidophilus [facility]) CAPS capsule Take 1 capsule by mouth daily.    [provider]  allopurinol (ZYLOPRIM) 300 MG tablet Take 1 tablet (300 mg total) by mouth daily. NEEDS APPOINTMENT FOR FURTHER REFILLS. 06/24/23   Christen Butter, NP  amLODipine (NORVASC) 10 MG tablet Take 1 tablet (10 mg total) by mouth daily. 07/11/23   Pollyann Savoy, MD  atorvastatin (LIPITOR) 40 MG tablet Take 1 tablet (40 mg total) by mouth daily. NEEDS APPOINTMENT FOR FURTHER REFILLS. 06/24/23   Christen Butter, NP  carvedilol (COREG) 12.5 MG tablet Take 1 tablet (12.5 mg total) by mouth 2 (two) times daily with a meal. NEEDS APPOINTMENT FOR FURTHER REFILLS. 06/24/23   Christen Butter, NP  cetirizine (ZYRTEC) 10 MG tablet Take 1 tablet (10 mg total) by mouth daily. 01/26/23   Christen Butter, NP  escitalopram (LEXAPRO) 10 MG tablet Take 1 tablet (10 mg total) by mouth daily. 01/26/23   Christen Butter, NP  fluticasone (FLONASE) 50 MCG/ACT nasal spray Place 2 sprays into both nostrils daily. Patient taking differently: Place 2 sprays into both nostrils daily as needed for allergies. 04/05/22   Christen Butter,  NP  Multiple Vitamin (MULTIVITAMIN PO) Take 1 tablet by mouth daily.    [provider]  Multiple Vitamins-Minerals (HAIR SKIN & NAILS) TABS Take 1 tablet by mouth daily.    [provider]  traMADol-acetaminophen (ULTRACET) 37.5-325 MG tablet Take 1 tablet by mouth at bedtime as needed and may repeat dose one time if needed for moderate pain. 04/14/23   Eustace Moore, MD     Allergies    Patient has no known allergies.   Review of Systems   Review of Systems Please see HPI for pertinent positives and negatives  Physical Exam BP (!) 183/120   Pulse 85   Temp 98.1 F (36.7 C)   Resp 17   Ht 5\' 2"  (1.575 m)   Wt 72.6 kg   SpO2 98%   BMI 29.27 kg/m   Physical Exam Vitals and nursing note reviewed.  Constitutional:      Appearance: Normal appearance.  HENT:     Head: Normocephalic and atraumatic.     Nose: Nose normal.     Mouth/Throat:     Mouth: Mucous membranes are moist.     Comments: Mild angioedema is limited to L lower lip Eyes:     Extraocular Movements: Extraocular movements intact.     Conjunctiva/sclera: Conjunctivae normal.  Cardiovascular:     Rate and Rhythm: Normal rate.  Pulmonary:     Effort: Pulmonary effort is normal.     Breath sounds: Normal  breath sounds. No stridor. No wheezing.  Abdominal:     General: Abdomen is flat.     Palpations: Abdomen is soft.     Tenderness: There is no abdominal tenderness.  Musculoskeletal:        General: No swelling. Normal range of motion.     Cervical back: Neck supple.  Skin:    General: Skin is warm and dry.  Neurological:     General: No focal deficit present.     Mental Status: He is alert.  Psychiatric:        Mood and Affect: Mood normal.     ED Results / Procedures / Treatments   EKG None  Procedures Procedures  Medications Ordered in the ED Medications  predniSONE (DELTASONE) tablet 60 mg (has no administration in time range)  diphenhydrAMINE (BENADRYL) capsule 25  mg (has no administration in time range)  amLODipine (NORVASC) tablet 10 mg (has no administration in time range)    Initial Impression and Plan  Patient here with localized angioedema of lower lip, no tongue or pharyngeal involvement. Airway is intact. Suspect Paxlovid or Linsinopril as cause. Will give a course of prednisone and benadryl, but advised this may not be effective. Recommend he stop his Paxlovid and lisinopril. Will refill his Amlodipine as BP is elevated here. Advised to follow up closely with PCP for re-evaluation and additional medication changes if needed.   ED Course       MDM Rules/Calculators/A&P Medical Decision Making Problems Addressed: Angioedema, initial encounter: acute illness or injury Uncontrolled hypertension: chronic illness or injury with exacerbation, progression, or side effects of treatment  Risk Prescription drug management.     Final Clinical Impression(s) / ED Diagnoses Final diagnoses:  Angioedema, initial encounter  Uncontrolled hypertension    Rx / DC Orders ED Discharge Orders          Ordered    predniSONE (STERAPRED UNI-PAK 21 TAB) 10 MG (21) TBPK tablet        07/11/23 0206    amLODipine (NORVASC) 10 MG tablet  Daily        07/11/23 0206             Pollyann Savoy, MD 07/11/23 [phone]

## 2023-07-11 NOTE — Telephone Encounter (Signed)
Copied from CRM [id number]. Topic: Clinical - Medication Question >> Jul 11, 2023  3:42 PM Almira Coaster wrote: Reason for CRM: Patient was seen in the ER due to covid where they prescribed Paxlovid, He was also on Linsinopril and they had him stop that due to a reaction it had with the Paxlovid causing patient's lips to swell up. He has some concerns on if he should start taking the Linsinopril again or change the dosage.

## 2023-07-11 NOTE — ED Triage Notes (Signed)
Pt taking Paxlovid for COVID dx and has taken 18 pills so far. Pt did not eat anything different or change his usual routine--only new med and now has swollen lip. LT side of lip is swollen. Pt not in resp distress, no itching, no hives.

## 2023-07-22 NOTE — Telephone Encounter (Signed)
 Per patient - no longer taking Paxlovid . He believes that this medication cause his allergic reactions (swollen lips). Patient was informed to restart his Lisinopril  medication as recommended. Patient agreeable with plan. Patient has confirmed to keep his upcoming appointment with provider scheduled on 07/29/23.

## 2023-07-29 ENCOUNTER — Ambulatory Visit (INDEPENDENT_AMBULATORY_CARE_PROVIDER_SITE_OTHER): Payer: 59 | Admitting: Medical-Surgical

## 2023-07-29 VITALS — BP 133/79 | HR 73 | Resp 20 | Ht 62.0 in | Wt 161.6 lb

## 2023-07-29 DIAGNOSIS — E782 Mixed hyperlipidemia: Secondary | ICD-10-CM

## 2023-07-29 DIAGNOSIS — M109 Gout, unspecified: Secondary | ICD-10-CM

## 2023-07-29 DIAGNOSIS — M25475 Effusion, left foot: Secondary | ICD-10-CM

## 2023-07-29 DIAGNOSIS — I10 Essential (primary) hypertension: Secondary | ICD-10-CM | POA: Diagnosis not present

## 2023-07-29 DIAGNOSIS — Z23 Encounter for immunization: Secondary | ICD-10-CM

## 2023-07-29 DIAGNOSIS — F419 Anxiety disorder, unspecified: Secondary | ICD-10-CM

## 2023-07-29 MED ORDER — ESCITALOPRAM OXALATE 10 MG PO TABS: 10.0000 mg | ORAL_TABLET | Freq: Every day | ORAL | 3 refills | Status: AC

## 2023-07-29 MED ORDER — AMLODIPINE BESYLATE 10 MG PO TABS: 10.0000 mg | ORAL_TABLET | Freq: Every day | ORAL | 3 refills | Status: AC

## 2023-07-29 MED ORDER — FLUTICASONE PROPIONATE 50 MCG/ACT NA SUSP: 2.0000 | Freq: Every day | NASAL | 6 refills | Status: DC

## 2023-07-29 MED ORDER — ATORVASTATIN CALCIUM 40 MG PO TABS: 40.0000 mg | ORAL_TABLET | Freq: Every day | ORAL | 3 refills | Status: DC

## 2023-07-29 MED ORDER — LORATADINE 10 MG PO TABS: 10.0000 mg | ORAL_TABLET | Freq: Every day | ORAL | 3 refills | Status: DC

## 2023-07-29 MED ORDER — CARVEDILOL 12.5 MG PO TABS: 12.5000 mg | ORAL_TABLET | Freq: Two times a day (BID) | ORAL | 3 refills | Status: AC

## 2023-07-29 MED ORDER — ALLOPURINOL 300 MG PO TABS: 300.0000 mg | ORAL_TABLET | Freq: Every day | ORAL | 3 refills | Status: DC

## 2023-07-29 NOTE — Progress Notes (Signed)
        Established patient visit  History, exam, impression, and plan:  1. Essential hypertension (Primary) Scott Joseph 53 year old male presenting today for follow-up on essential hypertension.  He is taking amlodipine 10 mg daily along with Coreg 12.5 mg twice daily.  Tolerating both medications well without side effects.  Following a low-sodium diet and aiming for regular intentional activity.  Denies concerning symptoms.  Cardiopulmonary exam normal.  Blood pressure is stable today at 133/79.  Continue amlodipine and carvedilol as prescribed.  Checking labs today. - amLODipine (NORVASC) 10 MG tablet; Take 1 tablet (10 mg total) by mouth daily.  Dispense: 90 tablet; Refill: 3 - carvedilol (COREG) 12.5 MG tablet; Take 1 tablet (12.5 mg total) by mouth 2 (two) times daily with a meal.  Dispense: 180 tablet; Refill: 3 - CBC with Differential/Platelet - CMP14+EGFR - Lipid panel  2. Gout of foot, unspecified cause, unspecified chronicity, unspecified laterality 3. Swelling of first metatarsophalangeal (MTP) joint of left foot History of gout currently treated with allopurinol 300 mg daily.  Tolerating the medication well without side effects.  Denies any recent flares.  Checking uric acid today.  Continue allopurinol as prescribed. - allopurinol (ZYLOPRIM) 300 MG tablet; Take 1 tablet (300 mg total) by mouth daily.  Dispense: 90 tablet; Refill: 3 - Uric acid  4. Mixed hyperlipidemia Taking atorvastatin 40 mg daily, tolerating well without side effects.  Aiming for a low-fat heart healthy diet.  Checking lipids today.  Continue atorvastatin. - atorvastatin (LIPITOR) 40 MG tablet; Take 1 tablet (40 mg total) by mouth daily.  Dispense: 90 tablet; Refill: 3  5. Anxiety Reports that he is doing well on Lexapro 10 mg daily.  Tolerates the medication without side effects.  Feels that this is keeping his mood stable and has no current concerns.  Denies SI/HI.  Mood, affect, thought pattern, cognition,  and speech all normal.  Continue Lexapro 10 mg daily. - escitalopram (LEXAPRO) 10 MG tablet; Take 1 tablet (10 mg total) by mouth daily.  Dispense: 90 tablet; Refill: 3  6. Need for influenza vaccination Flu vaccine given in office today. - Flu vaccine trivalent PF, 6mos and older(Flulaval,Afluria,Fluarix,Fluzone)  Procedures performed this visit: None.  Return in about 6 months (around 01/26/2024) for chronic disease follow up.  __________________________________ Thayer Ohm, DNP, APRN, FNP-BC Primary Care and Sports Medicine [facility]

## 2023-07-30 LAB — CBC WITH DIFFERENTIAL/PLATELET
Basophils Absolute: 0.1 10*3/uL (ref 0.0–0.2)
Basos: 1 %
EOS (ABSOLUTE): 0.2 10*3/uL (ref 0.0–0.4)
Eos: 2 %
Hematocrit: 42.9 % (ref 37.5–51.0)
Hemoglobin: 13.7 g/dL (ref 13.0–17.7)
Immature Grans (Abs): 0 10*3/uL (ref 0.0–0.1)
Immature Granulocytes: 0 %
Lymphocytes Absolute: 2.1 10*3/uL (ref 0.7–3.1)
Lymphs: 25 %
MCH: 29.1 pg (ref 26.6–33.0)
MCHC: 31.9 g/dL (ref 31.5–35.7)
MCV: 91 fL (ref 79–97)
Monocytes Absolute: 0.8 10*3/uL (ref 0.1–0.9)
Monocytes: 10 %
Neutrophils Absolute: 5.4 10*3/uL (ref 1.4–7.0)
Neutrophils: 62 %
Platelets: 397 10*3/uL (ref 150–450)
RBC: 4.71 x10E6/uL (ref 4.14–5.80)
RDW: 15.7 % — ABNORMAL HIGH (ref 11.6–15.4)
WBC: 8.6 10*3/uL (ref 3.4–10.8)

## 2023-07-30 LAB — CMP14+EGFR
ALT: 24 [IU]/L (ref 0–44)
AST: 22 [IU]/L (ref 0–40)
Albumin: 4.3 g/dL (ref 3.8–4.9)
Alkaline Phosphatase: 117 [IU]/L (ref 44–121)
BUN/Creatinine Ratio: 16 (ref 9–20)
BUN: 25 mg/dL — ABNORMAL HIGH (ref 6–24)
Bilirubin Total: 0.6 mg/dL (ref 0.0–1.2)
CO2: 22 mmol/L (ref 20–29)
Calcium: 9.8 mg/dL (ref 8.7–10.2)
Chloride: 105 mmol/L (ref 96–106)
Creatinine, Ser: 1.53 mg/dL — ABNORMAL HIGH (ref 0.76–1.27)
Globulin, Total: 2.6 g/dL (ref 1.5–4.5)
Glucose: 67 mg/dL — ABNORMAL LOW (ref 70–99)
Potassium: 4.7 mmol/L (ref 3.5–5.2)
Sodium: 142 mmol/L (ref 134–144)
Total Protein: 6.9 g/dL (ref 6.0–8.5)
eGFR: 54 mL/min/{1.73_m2} — ABNORMAL LOW (ref >59)

## 2023-07-30 LAB — LIPID PANEL
Chol/HDL Ratio: 4.8 {ratio} (ref 0.0–5.0)
Cholesterol, Total: 231 mg/dL — ABNORMAL HIGH (ref 100–199)
HDL: 48 mg/dL (ref >39)
LDL Chol Calc (NIH): 160 mg/dL — ABNORMAL HIGH (ref 0–99)
Triglycerides: 128 mg/dL (ref 0–149)
VLDL Cholesterol Cal: 23 mg/dL (ref 5–40)

## 2023-07-30 LAB — URIC ACID: Uric Acid: 4.8 mg/dL (ref 3.8–8.4)

## 2023-08-01 ENCOUNTER — Encounter: Payer: Self-pay | Admitting: Medical-Surgical

## 2023-08-01 DIAGNOSIS — E782 Mixed hyperlipidemia: Secondary | ICD-10-CM

## 2023-08-01 MED ORDER — ATORVASTATIN CALCIUM 80 MG PO TABS: 80.0000 mg | ORAL_TABLET | Freq: Every day | ORAL | 3 refills | Status: AC

## 2023-08-01 NOTE — Addendum Note (Signed)
Addended byChristen Butter on: 08/01/2023 05:25 PM   Modules accepted: Orders

## 2023-08-15 ENCOUNTER — Other Ambulatory Visit: Payer: Self-pay | Admitting: Medical-Surgical

## 2023-08-15 DIAGNOSIS — E782 Mixed hyperlipidemia: Secondary | ICD-10-CM

## 2023-08-15 DIAGNOSIS — M25475 Effusion, left foot: Secondary | ICD-10-CM

## 2023-08-15 DIAGNOSIS — M109 Gout, unspecified: Secondary | ICD-10-CM

## 2023-10-03 ENCOUNTER — Other Ambulatory Visit: Payer: Self-pay

## 2023-10-03 ENCOUNTER — Ambulatory Visit
Admission: EM | Admit: 2023-10-03 | Discharge: 2023-10-03 | Disposition: A | Discharge status: Home / Self Care | Attending: Family Medicine | Admitting: Family Medicine

## 2023-10-03 DIAGNOSIS — R197 Diarrhea, unspecified: Secondary | ICD-10-CM | POA: Diagnosis not present

## 2023-10-03 DIAGNOSIS — R112 Nausea with vomiting, unspecified: Secondary | ICD-10-CM | POA: Diagnosis not present

## 2023-10-03 MED ORDER — ONDANSETRON 4 MG PO TBDP
4.0000 mg | ORAL_TABLET | Freq: Once | ORAL | Status: AC
  Administered 2023-10-03: 4 mg via ORAL

## 2023-10-03 NOTE — Discharge Instructions (Signed)
 Make sure you are drinking lots of fluids Avoid fatty and fried foods.  Avoid milk products until symptoms have improved May take Imodium if needed for frequent diarrhea Call for problems

## 2023-10-03 NOTE — ED Provider Notes (Signed)
 [name]    CSN: [id number] Arrival date & time: 10/03/23  1559      History   Chief Complaint Chief Complaint  Patient presents with   Emesis   Diarrhea    HPI Scott Joseph is a 53 y.o. male.   HPI  Patient is here with his significant other.  They both went out Saturday and had a salmon dinner.  Within 12 hours they both had vomiting, and nausea.  Then developed diarrhea.  There is norovirus in the community.  They admit they do not know if they have a stomach flu or food poisoning.  Symptoms started yesterday.  No dizziness, fever, cough or cold symptoms  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- no seizure activity since his teen years   Sleep apnea    in process of getting study and cpap    Patient Active Problem List   Diagnosis Date Noted   Severe obstructive sleep apnea 10/25/2022   Primary osteoarthritis of both knees 10/09/2021   Gout of foot 02/22/2021   Mixed hyperlipidemia 02/22/2021   Swelling of first metatarsophalangeal (MTP) joint of left foot 07/30/2019   Essential hypertension 07/30/2019   Rheumatoid arthritis (HCC) 07/30/2019   Anxiety 07/30/2019    Past Surgical History:  Procedure Laterality Date   JOINT REPLACEMENT     Bilateral Hip Replacement   PLEURAL SCARIFICATION     TOTAL KNEE ARTHROPLASTY Right 08/31/2022   Procedure: TOTAL KNEE ARTHROPLASTY;  Surgeon: Teryl Lucy, MD;  Location: [facility];  Service: Orthopedics;  Laterality: Right;       Home Medications    Prior to Admission medications   Medication Sig Start Date End Date Taking? Authorizing Provider  acidophilus (RISAQUAD) CAPS capsule Take 1 capsule by mouth daily.    [provider]  allopurinol (ZYLOPRIM) 300 MG tablet Take 1 tablet (300 mg total) by mouth daily. 07/29/23    Christen Butter, NP  amLODipine (NORVASC) 10 MG tablet Take 1 tablet (10 mg total) by mouth daily. 07/29/23   Christen Butter, NP  atorvastatin (LIPITOR) 80 MG tablet Take 1 tablet (80 mg total) by mouth daily. 08/01/23   Christen Butter, NP  carvedilol (COREG) 12.5 MG tablet Take 1 tablet (12.5 mg total) by mouth 2 (two) times daily with a meal. 07/29/23   Christen Butter, NP  escitalopram (LEXAPRO) 10 MG tablet Take 1 tablet (10 mg total) by mouth daily. 07/29/23   Christen Butter, NP  fluticasone (FLONASE) 50 MCG/ACT nasal spray Place 2 sprays into both nostrils daily. 07/29/23   Christen Butter, NP  loratadine (CLARITIN) 10 MG tablet Take 1 tablet (10 mg total) by mouth daily. 07/29/23   Christen Butter, NP  Multiple Vitamin (MULTIVITAMIN PO) Take 1 tablet by mouth daily.    [provider]  Multiple Vitamins-Minerals (HAIR SKIN & NAILS) TABS Take 1 tablet by mouth daily.    [provider]  traMADol-acetaminophen (ULTRACET) 37.5-325 MG tablet Take 1 tablet by mouth at bedtime as needed and may repeat dose one time if needed for moderate pain. 04/14/23   Eustace Moore, MD    Family History Family History  Problem Relation Age of Onset   Hypertension Mother    CAD Mother    Hypertension  Father    Lung cancer Father    Diabetes Sister    Diabetes Brother    Hypertension Maternal Grandmother    Hypertension Maternal Grandfather    Hypertension Paternal Grandmother    Hypertension Paternal Grandfather    Diabetes Paternal Grandfather    Colon cancer Neg Hx    Esophageal cancer Neg Hx    Rectal cancer Neg Hx    Stomach cancer Neg Hx     Social History Social History   Tobacco Use   Smoking status: Never   Smokeless tobacco: Never  Vaping Use   Vaping status: Never Used  Substance Use Topics   Alcohol use: Yes    Comment: Rarely   Drug use: Never     Allergies   Paxlovid [nirmatrelvir-ritonavir]   Review of Systems Review of Systems See HPI  Physical Exam Triage Vital  Signs ED Triage Vitals  Encounter Vitals Group     BP 10/03/23 1625 133/82     Systolic BP Percentile --      Diastolic BP Percentile --      Pulse Rate 10/03/23 1625 (!) 54     Resp 10/03/23 1625 16     Temp 10/03/23 1625 98.1 F (36.7 C)     Temp Source 10/03/23 1625 Oral     SpO2 10/03/23 1625 99 %     Weight --      Height --      Head Circumference --      Peak Flow --      Pain Score 10/03/23 1626 0     Pain Loc --      Pain Education --      Exclude from Growth Chart --    No data found.  Updated Vital Signs BP 133/82   Pulse (!) 54   Temp 98.1 F (36.7 C) (Oral)   Resp 16   SpO2 99%    Physical Exam Constitutional:      General: He is not in acute distress.    Appearance: He is well-developed.  HENT:     Head: Normocephalic and atraumatic.     Mouth/Throat:     Mouth: Mucous membranes are moist.  Eyes:     Conjunctiva/sclera: Conjunctivae normal.     Pupils: Pupils are equal, round, and reactive to light.  Cardiovascular:     Rate and Rhythm: Normal rate.  Pulmonary:     Effort: Pulmonary effort is normal. No respiratory distress.  Abdominal:     General: Bowel sounds are normal. There is no distension.     Palpations: Abdomen is soft.  Musculoskeletal:        General: Normal range of motion.     Cervical back: Normal range of motion.  Skin:    General: Skin is warm and dry.  Neurological:     Mental Status: He is alert.      UC Treatments / Results  Labs (all labs ordered are listed, but only abnormal results are displayed) Labs Reviewed - No data to display  EKG   Radiology No results found.  Procedures Procedures (including critical care time)  Medications Ordered in UC Medications  ondansetron (ZOFRAN-ODT) disintegrating tablet 4 mg (4 mg Oral Given 10/03/23 1629)    Initial Impression / Assessment and Plan / UC Course  I have reviewed the triage vital signs and the nursing notes.  Pertinent labs & imaging results that were  available during my care of the patient were reviewed by me  and considered in my medical decision making (see chart for details).     Reviewed this might be food poisoning versus gastroenteritis from norovirus.  Treatment is the same at this point.  Antibiotics not indicated.  Prevent dehydration. Final Clinical Impressions(s) / UC Diagnoses   Final diagnoses:  Nausea vomiting and diarrhea     Discharge Instructions      Make sure you are drinking lots of fluids Avoid fatty and fried foods.  Avoid milk products until symptoms have improved May take Imodium if needed for frequent diarrhea Call for problems   ED Prescriptions   None    PDMP not reviewed this encounter.   Eustace Moore, MD 10/03/23 [phone]

## 2023-10-03 NOTE — ED Triage Notes (Signed)
 Ate salmon Saturday, Sunday started having n/v/d. No fever. No otc meds.

## 2024-01-26 ENCOUNTER — Ambulatory Visit (INDEPENDENT_AMBULATORY_CARE_PROVIDER_SITE_OTHER): Payer: 59 | Admitting: Medical-Surgical

## 2024-01-26 ENCOUNTER — Encounter: Payer: Self-pay | Admitting: Medical-Surgical

## 2024-01-26 VITALS — BP 136/87 | HR 70 | Resp 20 | Ht 62.0 in | Wt 175.0 lb

## 2024-01-26 DIAGNOSIS — M109 Gout, unspecified: Secondary | ICD-10-CM

## 2024-01-26 DIAGNOSIS — G4733 Obstructive sleep apnea (adult) (pediatric): Secondary | ICD-10-CM

## 2024-01-26 DIAGNOSIS — L03032 Cellulitis of left toe: Secondary | ICD-10-CM | POA: Diagnosis not present

## 2024-01-26 DIAGNOSIS — E782 Mixed hyperlipidemia: Secondary | ICD-10-CM

## 2024-01-26 DIAGNOSIS — R0982 Postnasal drip: Secondary | ICD-10-CM

## 2024-01-26 DIAGNOSIS — I10 Essential (primary) hypertension: Secondary | ICD-10-CM

## 2024-01-26 DIAGNOSIS — F419 Anxiety disorder, unspecified: Secondary | ICD-10-CM

## 2024-01-26 MED ORDER — LISINOPRIL 40 MG PO TABS: 40.0000 mg | ORAL_TABLET | Freq: Every day | ORAL | 1 refills | Status: AC

## 2024-01-26 MED ORDER — FLUTICASONE PROPIONATE 50 MCG/ACT NA SUSP: 2.0000 | Freq: Every day | NASAL | 6 refills | Status: AC

## 2024-01-26 MED ORDER — LORATADINE 10 MG PO TABS: 10.0000 mg | ORAL_TABLET | Freq: Every day | ORAL | 3 refills | Status: AC

## 2024-01-26 NOTE — Progress Notes (Signed)
 Established patient visit  Discussed the use of AI scribe software for clinical note transcription with the patient, who gave verbal consent to proceed.  History of Present Illness   Jovanni Rash is a 53 year old male with hypertension who presents for a six-month follow-up visit.  Hypertension - Elevated home blood pressure readings up to 175 mmHg after running out of lisinopril  one month ago - Continues amlodipine  10 mg daily and carvedilol  12.5 mg twice daily  Left lateral great toe nail border inflammation - Bleeding on the side of the left big toe during a pedicure - Associated pain, redness, and swelling  Allergic rhinitis and postnasal drip - Postnasal drip causing blocked nostril and throat clearing at night - Sleep disruption due to symptoms - Not using Flonase  as prescribed and requests refill - Sometimes uses Claritin  for allergy symptoms  Gout - Takes allopurinol  300 mg for gout management   Right knee injury - Had a recent fall onto his right knee s/p total knee replacement - Now experiences knee stiffness, especially at night - Plans to manage knee stiffness at home without redoing physical therapy  Hyperlipidemia - Takes atorvastatin  80 mg for cholesterol management  Obstructive sleep apnea - Uses CPAP machine for sleep apnea - Considering alternative treatments such as mouth guards  Weight management and lifestyle concerns - Concerned about weight - Drinks dandelion tea for kidney health - Acknowledges need for exercise and dietary changes        Physical Exam Vitals and nursing note reviewed.  Constitutional:      General: He is not in acute distress.    Appearance: Normal appearance. He is obese. He is not ill-appearing.  HENT:     Head: Normocephalic and atraumatic.  Cardiovascular:     Rate and Rhythm: Normal rate and regular rhythm.     Pulses: Normal pulses.     Heart sounds: Normal heart sounds. No murmur heard.    No friction  rub. No gallop.  Pulmonary:     Effort: Pulmonary effort is normal. No respiratory distress.     Breath sounds: Normal breath sounds.  Skin:    General: Skin is warm and dry.  Neurological:     Mental Status: He is alert and oriented to person, place, and time.  Psychiatric:        Mood and Affect: Mood normal.        Behavior: Behavior normal.        Thought Content: Thought content normal.        Judgment: Judgment normal.     Assessment and Plan    Hypertension Hypertension uncontrolled due to lisinopril  non-adherence. Blood pressure elevated at 175 mmHg. Emphasized medication adherence and lifestyle modifications. - Prescribe lisinopril  40 mg daily. - Recheck blood pressure in two weeks during a nurse visit. - Encourage lifestyle modifications including diet and exercise.  Obstructive Sleep Apnea Uses CPAP. Considering mandibular advancement device. Discussed benefits, challenges, and insurance coverage for GLP-1 agonist for weight loss and sleep apnea management. - Discuss potential referral to a dentist for evaluation of a mandibular advancement device. - Encourage weight loss through diet and exercise. - Discuss the possibility of insurance coverage for GLP-1 agonist for weight loss and sleep apnea management.  Allergic Rhinitis Postnasal drip and nasal congestion due to non-adherence to Flonase . - Prescribe Flonase  with instructions to use one spray in each nostril twice a day or two sprays once a day. - Continue Claritin  for  allergy symptoms.  Paronychia Paronychia of left big toe likely from pedicure trauma. Area healing well without significant drainage or fever at this time. Advised on home care. - Advise Epsom salt soaks. - Keep the area clean and dry. - Monitor for signs of infection such as drainage or fever.  Gout On allopurinol  300 mg. No acute issues.  Hyperlipidemia On atorvastatin  80 mg. No acute issues.  General Health Maintenance Due for routine  blood work. Discussed importance of healthy lifestyle. - Order routine blood work. - Schedule a follow-up visit in six months, which can be a physical examination.     Anxiety Stable on Lexapro . Denies SI/HI. No current concerns. - Continue Lexapro .   Return in about 2 weeks (around 02/09/2024) for nurse visit for BP check.  __________________________________ Zada FREDRIK Palin, DNP, APRN, FNP-BC Primary Care and Sports Medicine [facility]

## 2024-01-27 ENCOUNTER — Ambulatory Visit: Payer: Self-pay | Admitting: Medical-Surgical

## 2024-01-27 LAB — CMP14+EGFR
ALT: 51 IU/L — ABNORMAL HIGH (ref 0–44)
AST: 36 IU/L (ref 0–40)
Albumin: 4.4 g/dL (ref 3.8–4.9)
Alkaline Phosphatase: 105 IU/L (ref 44–121)
BUN/Creatinine Ratio: 12 (ref 9–20)
BUN: 20 mg/dL (ref 6–24)
Bilirubin Total: 0.5 mg/dL (ref 0.0–1.2)
CO2: 23 mmol/L (ref 20–29)
Calcium: 10.1 mg/dL (ref 8.7–10.2)
Chloride: 102 mmol/L (ref 96–106)
Creatinine, Ser: 1.7 mg/dL — ABNORMAL HIGH (ref 0.76–1.27)
Globulin, Total: 2.3 g/dL (ref 1.5–4.5)
Glucose: 67 mg/dL — ABNORMAL LOW (ref 70–99)
Potassium: 4.4 mmol/L (ref 3.5–5.2)
Sodium: 139 mmol/L (ref 134–144)
Total Protein: 6.7 g/dL (ref 6.0–8.5)
eGFR: 47 mL/min/1.73 — ABNORMAL LOW (ref >59)

## 2024-01-27 LAB — LIPID PANEL
Chol/HDL Ratio: 4.2 ratio (ref 0.0–5.0)
Cholesterol, Total: 180 mg/dL (ref 100–199)
HDL: 43 mg/dL (ref >39)
LDL Chol Calc (NIH): 105 mg/dL — ABNORMAL HIGH (ref 0–99)
Triglycerides: 182 mg/dL — ABNORMAL HIGH (ref 0–149)
VLDL Cholesterol Cal: 32 mg/dL (ref 5–40)

## 2024-01-27 LAB — URIC ACID: Uric Acid: 7.6 mg/dL (ref 3.8–8.4)

## 2024-02-09 ENCOUNTER — Ambulatory Visit

## 2024-03-04 IMAGING — MR MR KNEE*R* W/O CM
7 series · 40 of 40 positions shown · non-contrast
Comparison: Radiograph dated August 26, 2020

CLINICAL DATA: Right knee pain and swelling for 6 months. Tripped
at work. No surgery. Concern for meniscal injury

EXAM:
MRI OF THE RIGHT KNEE WITHOUT CONTRAST
TECHNIQUE: Multiplanar, multisequence MR imaging of the right knee was
performed. No intravenous contrast was administered.

[Series 10: T2 fat-sat · axial · 4.0mm · 0.62mm/px · z∈[-90,+80]mm · 6 of 35 slices shown (1 of 3)]
[im 1/35]
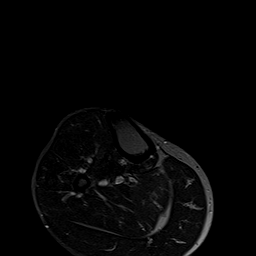
[im 7/35]
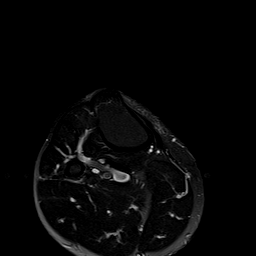
[im 14/35]
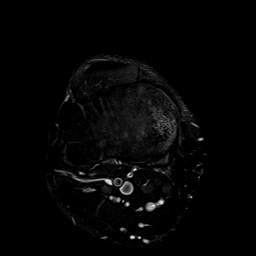
[im 21/35]
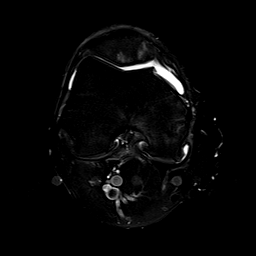
[im 28/35]
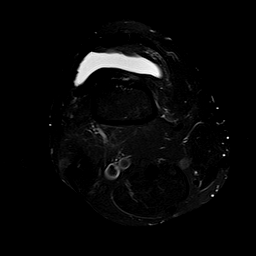
[im 35/35]
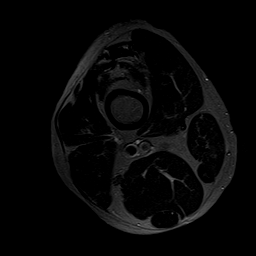

[Series 11: T1 · coronal · 4.0mm · 0.59mm/px · 6 of 30 slices shown]
[im 1/30]
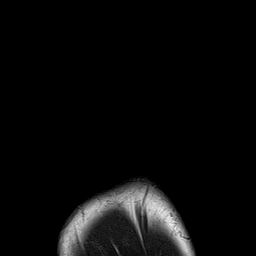
[im 6/30]
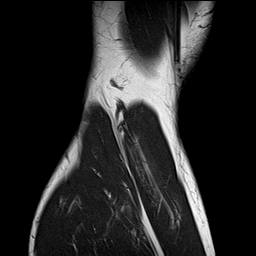
[im 12/30]
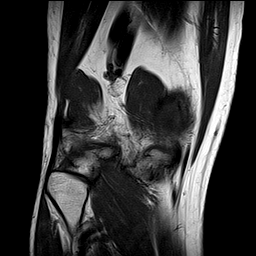
[im 18/30]
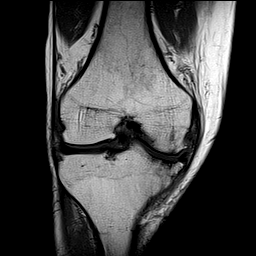
[im 24/30]
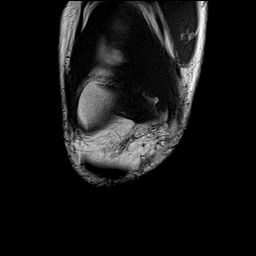
[im 30/30]
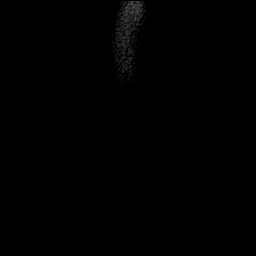

[Series 12: T2 fat-sat · coronal · 4.0mm · 0.59mm/px · 6 of 30 slices shown (2 of 3)]
[im 1/30]
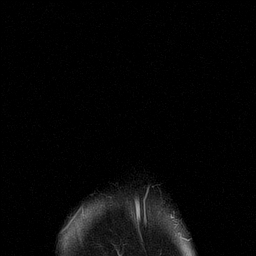
[im 6/30]
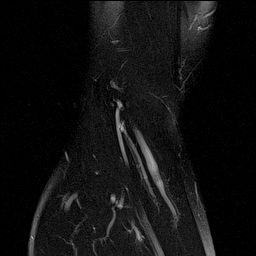
[im 12/30]
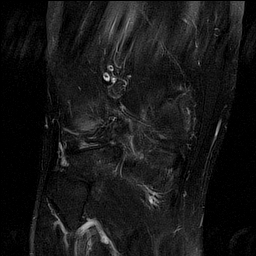
[im 18/30]
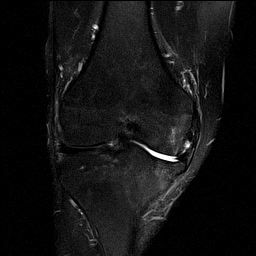
[im 24/30]
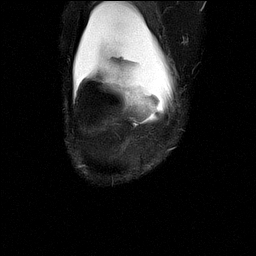
[im 30/30]
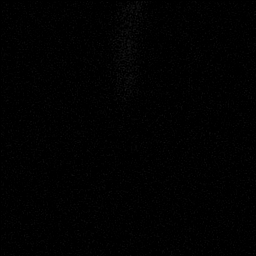

[Series 13: PD fat-sat · coronal · 4.0mm · 0.59mm/px · 6 of 30 slices shown (1 of 3)]
[im 1/30]
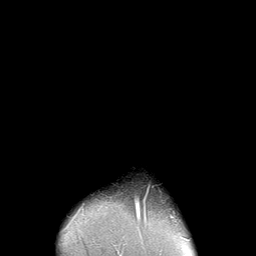
[im 6/30]
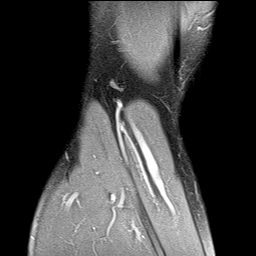
[im 12/30]
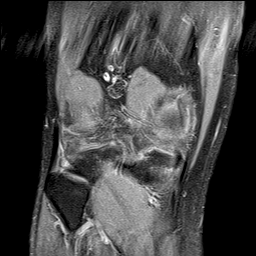
[im 18/30]
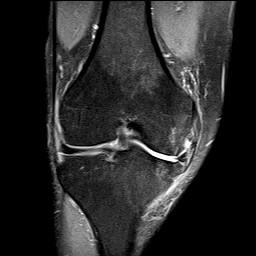
[im 24/30]
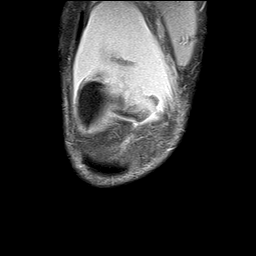
[im 30/30]
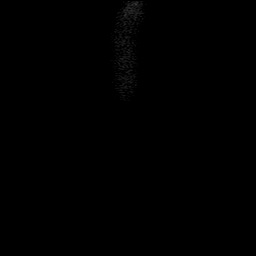

[Series 14: PD fat-sat · sagittal · 3.0mm · 0.59mm/px · 6 of 32 slices shown (2 of 3)]
[im 1/32]
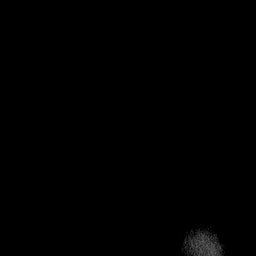
[im 7/32]
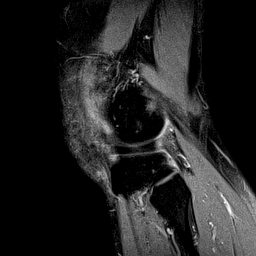
[im 13/32]
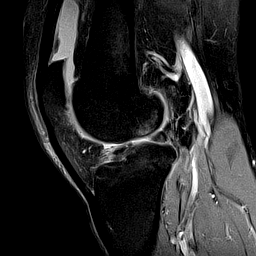
[im 19/32]
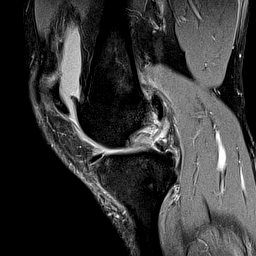
[im 25/32]
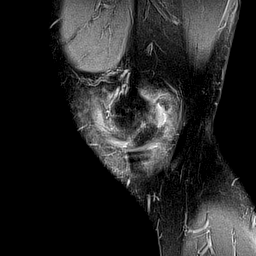
[im 32/32]
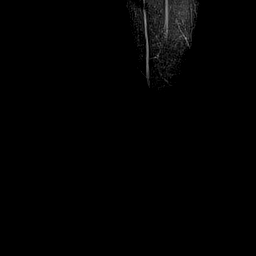

[Series 15: T2 fat-sat · sagittal · 3.0mm · 0.59mm/px · 6 of 32 slices shown (3 of 3)]
[im 1/32]
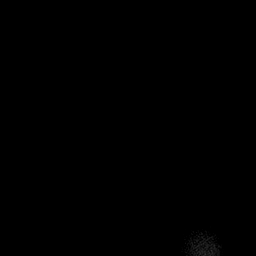
[im 7/32]
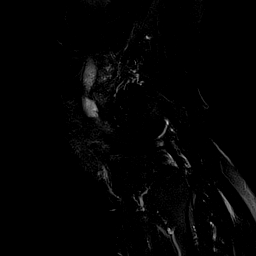
[im 13/32]
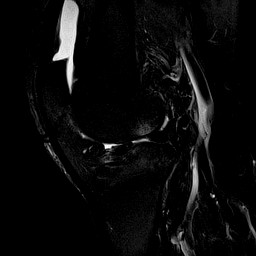
[im 19/32]
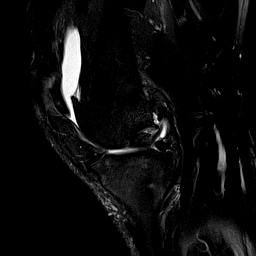
[im 25/32]
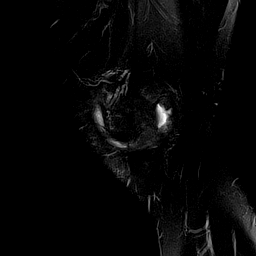
[im 32/32]
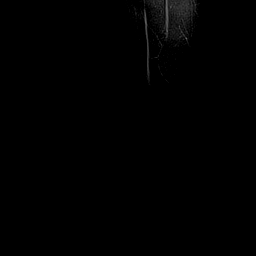

[Series 16: PD fat-sat · coronal · 2.0mm · 0.59mm/px · 4 of 19 slices shown (3 of 3)]
[im 1/19]
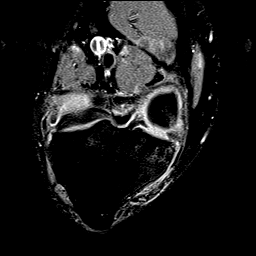
[im 7/19]
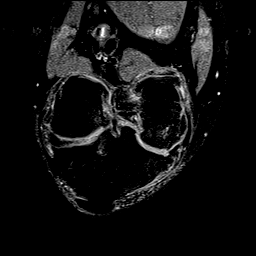
[im 13/19]
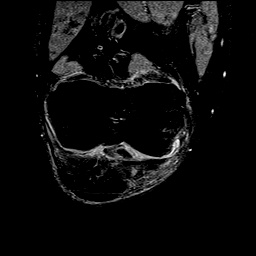
[im 19/19]
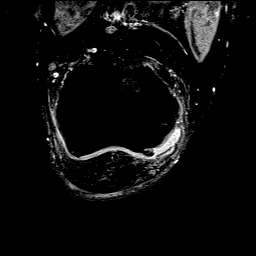

[40 of 40 positions shown; findings below may reference images not displayed]

FINDINGS: MENISCI

Medial: Complex tear of the body/posterior horn of the medial
meniscus.

Lateral: Intact.

LIGAMENTS

Cruciates: ACL and PCL are intact.

Collaterals: Medial collateral ligament is intact. Lateral
collateral ligament complex is intact.

CARTILAGE

Patellofemoral: Near complete loss of the articular cartilage with
multifocal subchondral cystic changes.

Medial: Near complete loss of the articular cartilage with
subcortical edema and multifocal cystic changes of the medial
femoral condyle and tibia plateau

Lateral: Articular cartilage thinning and deep fissuring with focal
full-thickness defect of the lateral femoral condyle.

JOINT: Moderate joint effusion. Normal Catinha Ismael. No plical
thickening.

POPLITEAL FOSSA: Popliteus tendon is intact. No Baker's cyst.

EXTENSOR MECHANISM: Intact quadriceps tendon. Mild insertional
patellar tendinopathy. Intact lateral patellar retinaculum. Intact
medial patellar retinaculum. Intact MPFL.

BONES: No aggressive osseous lesion. No fracture or dislocation.
Moderate size tricompartmental osteophytes

Other: No fluid collection or hematoma. Muscles are normal.
IMPRESSION: 1. Moderate to severe tricompartmental osteoarthritis most prominent
in the medial tibiofemoral and patellofemoral compartments with near
complete loss of the articular cartilage and subchondral cystic
changes.

2. Complex degenerative tear of the body/posterior horn of the
medial meniscus.

3.  Cruciate and collateral ligaments are intact.

4.  No evidence of fracture or dislocation.

5. Moderate suprapatellar joint effusion, likely reactive secondary
to osteoarthritis.

6.  Mild patellar tendinopathy.

## 2024-03-13 ENCOUNTER — Encounter: Payer: Self-pay | Admitting: Sports Medicine

## 2024-04-04 ENCOUNTER — Other Ambulatory Visit: Payer: Self-pay | Admitting: Medical Genetics

## 2024-05-02 ENCOUNTER — Other Ambulatory Visit

## 2024-05-02 DIAGNOSIS — Z006 Encounter for examination for normal comparison and control in clinical research program: Secondary | ICD-10-CM

## 2024-05-18 LAB — GENECONNECT MOLECULAR SCREEN: Genetic Analysis Overall Interpretation: NEGATIVE

## 2024-05-23 ENCOUNTER — Ambulatory Visit
Admission: EM | Admit: 2024-05-23 | Discharge: 2024-05-23 | Disposition: A | Discharge status: Home / Self Care | Attending: Family Medicine | Admitting: Family Medicine

## 2024-05-23 DIAGNOSIS — K529 Noninfective gastroenteritis and colitis, unspecified: Secondary | ICD-10-CM

## 2024-05-23 DIAGNOSIS — R197 Diarrhea, unspecified: Secondary | ICD-10-CM

## 2024-05-23 NOTE — Discharge Instructions (Signed)
Make sure you push fluids drinking mostly water but mix it with Gatorade.  Try to eat light meals including soups, broths and soft foods, fruits.  Imodium can help with diarrhea but use this carefully limiting it to 1-2 times per day only if you are having a lot of diarrhea.  Please return to the clinic if symptoms worsen or you start having severe abdominal pain not helped by taking Tylenol or start having bloody stools or blood in the vomit.

## 2024-05-23 NOTE — ED Provider Notes (Signed)
 [facility]  Note:  This document was prepared using Conservation officer, historic buildings and may include unintentional dictation errors.  MRN: [id number] DOB: 1971/02/09  Subjective:   Scott Joseph is a 53 y.o. male presenting for 1 week history of persistent bouts of diarrhea. No fever, n/v, abdominal pain, bloody stools, recent antibiotic use, hospitalizations or long distance travel.  Has not eaten raw foods, drank unfiltered water .  No history of GI disorders including Crohn's, IBS, ulcerative colitis.  Patient has been eating takeout and fast food.  He missed work today as he had difficulty with his sleep apnea.  Fell asleep watching TV and did not use his CPAP machine.  Would like a work note for this.  No current facility-administered medications for this encounter.  Current Outpatient Medications:    acidophilus (RISAQUAD) CAPS capsule, Take 1 capsule by mouth daily., Disp: , Rfl:    allopurinol  (ZYLOPRIM ) 300 MG tablet, Take 1 tablet (300 mg total) by mouth daily., Disp: 90 tablet, Rfl: 3   amLODipine  (NORVASC ) 10 MG tablet, Take 1 tablet (10 mg total) by mouth daily., Disp: 90 tablet, Rfl: 3   atorvastatin  (LIPITOR) 80 MG tablet, Take 1 tablet (80 mg total) by mouth daily., Disp: 90 tablet, Rfl: 3   carvedilol  (COREG ) 12.5 MG tablet, Take 1 tablet (12.5 mg total) by mouth 2 (two) times daily with a meal., Disp: 180 tablet, Rfl: 3   escitalopram  (LEXAPRO ) 10 MG tablet, Take 1 tablet (10 mg total) by mouth daily., Disp: 90 tablet, Rfl: 3   fluticasone  (FLONASE ) 50 MCG/ACT nasal spray, Place 2 sprays into both nostrils daily., Disp: 16 g, Rfl: 6   lisinopril  (ZESTRIL ) 40 MG tablet, Take 1 tablet (40 mg total) by mouth daily., Disp: 90 tablet, Rfl: 1   loratadine  (CLARITIN ) 10 MG tablet, Take 1 tablet (10 mg total) by mouth daily., Disp: 90 tablet, Rfl: 3   Multiple Vitamin (MULTIVITAMIN PO), Take 1 tablet by mouth daily., Disp: , Rfl:    Multiple  Vitamins-Minerals (HAIR SKIN & NAILS) TABS, Take 1 tablet by mouth daily., Disp: , Rfl:    Allergies  Allergen Reactions   Paxlovid  [Nirmatrelvir-Ritonavir] Swelling and Hypertension    SWOLLEN LIPS     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- no seizure activity since his teen years   Sleep apnea    in process of getting study and cpap     Past Surgical History:  Procedure Laterality Date   JOINT REPLACEMENT     Bilateral Hip Replacement   PLEURAL SCARIFICATION     TOTAL KNEE ARTHROPLASTY Right 08/31/2022   Procedure: TOTAL KNEE ARTHROPLASTY;  Surgeon: Josefina Chew, MD;  Location: [facility];  Service: Orthopedics;  Laterality: Right;    Family History  Problem Relation Age of Onset   Hypertension Mother    CAD Mother    Hypertension Father    Lung cancer Father    Diabetes Sister    Diabetes Brother    Hypertension Maternal Grandmother    Hypertension Maternal Grandfather    Hypertension Paternal Grandmother    Hypertension Paternal Grandfather    Diabetes Paternal Grandfather    Colon cancer Neg Hx    Esophageal cancer Neg Hx    Rectal  cancer Neg Hx    Stomach cancer Neg Hx     Social History   Tobacco Use   Smoking status: Never   Smokeless tobacco: Never  Vaping Use   Vaping status: Never Used  Substance Use Topics   Alcohol use: Yes    Comment: Rarely   Drug use: Never    ROS   Objective:   Vitals: BP (!) 144/84 (BP Location: Right Arm)   Pulse 72   Temp 98.7 F (37.1 C) (Oral)   Resp 18   SpO2 97%   Physical Exam Constitutional:      General: He is not in acute distress.    Appearance: Normal appearance. He is well-developed and normal weight. He is not ill-appearing, toxic-appearing or diaphoretic.  HENT:     Head: Normocephalic and atraumatic.     Right Ear:  External ear normal.     Left Ear: External ear normal.     Nose: Nose normal.     Mouth/Throat:     Mouth: Mucous membranes are moist.  Eyes:     General: No scleral icterus.       Right eye: No discharge.        Left eye: No discharge.     Extraocular Movements: Extraocular movements intact.     Conjunctiva/sclera: Conjunctivae normal.  Cardiovascular:     Rate and Rhythm: Normal rate and regular rhythm.     Heart sounds: No murmur heard.    No friction rub. No gallop.  Pulmonary:     Effort: Pulmonary effort is normal. No respiratory distress.     Breath sounds: No wheezing or rales.  Abdominal:     General: Bowel sounds are normal. There is no distension.     Palpations: Abdomen is soft. There is no mass.     Tenderness: There is no abdominal tenderness. There is no right CVA tenderness, left CVA tenderness, guarding or rebound.  Musculoskeletal:     Cervical back: Normal range of motion.  Skin:    General: Skin is warm and dry.  Neurological:     Mental Status: He is alert and oriented to person, place, and time.  Psychiatric:        Mood and Affect: Mood normal.        Behavior: Behavior normal.        Thought Content: Thought content normal.        Judgment: Judgment normal.    Assessment and Plan :   PDMP not reviewed this encounter.  1. Colitis   2. Diarrhea, unspecified type    Will manage for suspected colitis with supportive care.  Recommended patient hydrate well, eat light meals and maintain electrolytes.  Will use Imodium for diarrhea. Counseled patient on potential for adverse effects with medications prescribed/recommended today, ER and return-to-clinic precautions discussed, patient verbalized understanding.    Christopher Savannah, PA-C 05/23/24 1419

## 2024-05-23 NOTE — ED Triage Notes (Signed)
 Pt reports diarrhea x 1 week; his heart rate was high last night when he woke up, states he went to bed without his CPAP. Denies at this moment, palpitations, headache, vision changes, nausea, vomiting

## 2024-07-27 ENCOUNTER — Encounter: Admitting: Medical-Surgical

## 2024-08-01 ENCOUNTER — Other Ambulatory Visit: Payer: Self-pay

## 2024-08-01 DIAGNOSIS — M109 Gout, unspecified: Secondary | ICD-10-CM

## 2024-08-01 DIAGNOSIS — M25475 Effusion, left foot: Secondary | ICD-10-CM

## 2024-08-01 MED ORDER — ALLOPURINOL 300 MG PO TABS: 300.0000 mg | ORAL_TABLET | Freq: Every day | ORAL | 0 refills | Status: AC

## 2024-08-14 ENCOUNTER — Encounter: Admitting: Medical-Surgical
